# Patient Record
Sex: Female | Born: 1950 | Race: White | Hispanic: No | Smoking: Former smoker
Health system: Southern US, Community
[De-identification: ages and names within clinical notes are randomized; demographics above are authoritative.]

## PROBLEM LIST (undated history)

## (undated) DIAGNOSIS — D649 Anemia, unspecified: Secondary | ICD-10-CM

## (undated) DIAGNOSIS — J189 Pneumonia, unspecified organism: Secondary | ICD-10-CM

## (undated) DIAGNOSIS — M1990 Unspecified osteoarthritis, unspecified site: Secondary | ICD-10-CM

## (undated) DIAGNOSIS — R112 Nausea with vomiting, unspecified: Secondary | ICD-10-CM

## (undated) DIAGNOSIS — C801 Malignant (primary) neoplasm, unspecified: Secondary | ICD-10-CM

## (undated) DIAGNOSIS — Z9889 Other specified postprocedural states: Secondary | ICD-10-CM

## (undated) DIAGNOSIS — I10 Essential (primary) hypertension: Secondary | ICD-10-CM

## (undated) HISTORY — PX: TUBAL LIGATION: SHX77

## (undated) HISTORY — PX: MASTECTOMY, RADICAL: SHX710

## (undated) HISTORY — PX: SIMPLE MASTECTOMY: SHX2409

## (undated) HISTORY — PX: BREAST SURGERY: SHX581

## (undated) HISTORY — PX: COSMETIC SURGERY: SHX468

## (undated) HISTORY — PX: ABDOMINAL HYSTERECTOMY: SHX81

---

## 1970-09-21 HISTORY — PX: KNEE SURGERY: SHX244

## 1989-09-21 DIAGNOSIS — C801 Malignant (primary) neoplasm, unspecified: Secondary | ICD-10-CM

## 1989-09-21 HISTORY — DX: Malignant (primary) neoplasm, unspecified: C80.1

## 2007-06-15 LAB — HM COLONOSCOPY

## 2014-05-13 ENCOUNTER — Emergency Department (HOSPITAL_COMMUNITY): Payer: Self-pay

## 2014-05-13 ENCOUNTER — Encounter (HOSPITAL_COMMUNITY): Payer: Self-pay | Admitting: Emergency Medicine

## 2014-05-13 ENCOUNTER — Emergency Department (HOSPITAL_COMMUNITY)
Admission: EM | Admit: 2014-05-13 | Discharge: 2014-05-13 | Disposition: A | Discharge status: Home / Self Care | Payer: Self-pay | Attending: Emergency Medicine | Admitting: Emergency Medicine

## 2014-05-13 DIAGNOSIS — Z853 Personal history of malignant neoplasm of breast: Secondary | ICD-10-CM | POA: Insufficient documentation

## 2014-05-13 DIAGNOSIS — M79609 Pain in unspecified limb: Secondary | ICD-10-CM | POA: Insufficient documentation

## 2014-05-13 DIAGNOSIS — M1990 Unspecified osteoarthritis, unspecified site: Secondary | ICD-10-CM

## 2014-05-13 DIAGNOSIS — Z880 Allergy status to penicillin: Secondary | ICD-10-CM | POA: Insufficient documentation

## 2014-05-13 DIAGNOSIS — Z859 Personal history of malignant neoplasm, unspecified: Secondary | ICD-10-CM | POA: Insufficient documentation

## 2014-05-13 DIAGNOSIS — M19079 Primary osteoarthritis, unspecified ankle and foot: Secondary | ICD-10-CM | POA: Insufficient documentation

## 2014-05-13 DIAGNOSIS — Z79899 Other long term (current) drug therapy: Secondary | ICD-10-CM | POA: Insufficient documentation

## 2014-05-13 HISTORY — DX: Malignant (primary) neoplasm, unspecified: C80.1

## 2014-05-13 LAB — COMPREHENSIVE METABOLIC PANEL
ALT: 11 U/L (ref 0–35)
AST: 12 U/L (ref 0–37)
Albumin: 4 g/dL (ref 3.5–5.2)
Alkaline Phosphatase: 81 U/L (ref 39–117)
Anion gap: 14 (ref 5–15)
BUN: 16 mg/dL (ref 6–23)
CO2: 25 meq/L (ref 19–32)
Calcium: 9.8 mg/dL (ref 8.4–10.5)
Chloride: 102 mEq/L (ref 96–112)
Creatinine, Ser: 0.91 mg/dL (ref 0.50–1.10)
GFR calc Af Amer: 76 mL/min — ABNORMAL LOW (ref >90)
GFR, EST NON AFRICAN AMERICAN: 66 mL/min — AB (ref >90)
Glucose, Bld: 107 mg/dL — ABNORMAL HIGH (ref 70–99)
Potassium: 3.6 mEq/L — ABNORMAL LOW (ref 3.7–5.3)
SODIUM: 141 meq/L (ref 137–147)
Total Bilirubin: 0.8 mg/dL (ref 0.3–1.2)
Total Protein: 7.8 g/dL (ref 6.0–8.3)

## 2014-05-13 LAB — CBC WITH DIFFERENTIAL/PLATELET
BASOS PCT: 0 % (ref 0–1)
Basophils Absolute: 0 10*3/uL (ref 0.0–0.1)
Eosinophils Absolute: 0 10*3/uL (ref 0.0–0.7)
Eosinophils Relative: 0 % (ref 0–5)
HCT: 43.5 % (ref 36.0–46.0)
Hemoglobin: 14.7 g/dL (ref 12.0–15.0)
LYMPHS PCT: 13 % (ref 12–46)
Lymphs Abs: 1.5 10*3/uL (ref 0.7–4.0)
MCH: 33.3 pg (ref 26.0–34.0)
MCHC: 33.8 g/dL (ref 30.0–36.0)
MCV: 98.6 fL (ref 78.0–100.0)
Monocytes Absolute: 1 10*3/uL (ref 0.1–1.0)
Monocytes Relative: 8 % (ref 3–12)
NEUTROS ABS: 9.2 10*3/uL — AB (ref 1.7–7.7)
Neutrophils Relative %: 79 % — ABNORMAL HIGH (ref 43–77)
PLATELETS: 246 10*3/uL (ref 150–400)
RBC: 4.41 MIL/uL (ref 3.87–5.11)
RDW: 12.8 % (ref 11.5–15.5)
WBC: 11.7 10*3/uL — ABNORMAL HIGH (ref 4.0–10.5)

## 2014-05-13 LAB — SEDIMENTATION RATE: SED RATE: 27 mm/h — AB (ref 0–22)

## 2014-05-13 MED ORDER — MORPHINE SULFATE 4 MG/ML IJ SOLN
6.0000 mg | Freq: Once | INTRAMUSCULAR | Status: AC
  Administered 2014-05-13: 6 mg via INTRAVENOUS
  Filled 2014-05-13: qty 2

## 2014-05-13 MED ORDER — HYDROMORPHONE HCL PF 1 MG/ML IJ SOLN
1.0000 mg | Freq: Once | INTRAMUSCULAR | Status: AC
  Administered 2014-05-13: 1 mg via INTRAVENOUS
  Filled 2014-05-13: qty 1

## 2014-05-13 MED ORDER — MORPHINE SULFATE 30 MG PO TABS: 30.0000 mg | ORAL_TABLET | Freq: Four times a day (QID) | ORAL | Status: DC | PRN

## 2014-05-13 NOTE — Progress Notes (Signed)
VASCULAR LAB PRELIMINARY  PRELIMINARY  PRELIMINARY  PRELIMINARY  Right lower extremity venous duplex completed.    Preliminary report:  Right:  No evidence of DVT, superficial thrombosis, or Baker's cyst.  Tasmin Exantus, RVT 05/13/2014, 5:19 PM

## 2014-05-13 NOTE — ED Provider Notes (Addendum)
CSN: [id number]     Arrival date & time 05/13/14  1423 History   First MD Initiated Contact with Patient 05/13/14 1541     Chief Complaint  Patient presents with  . Foot Pain     (Consider location/radiation/quality/duration/timing/severity/associated sxs/prior Treatment) The history is provided by the patient.  Megan Rose is a 63 y.o. female hx of breast cancer here with right foot swelling and pain. Acute onset of right foot and ankle pain since last night. She also states that her her calf is tender as well. Denies any history of blood clots. Denies any injury to the foot or any history of gout. Denies any chest pressure shortness of breath. She states that she has low-grade temperature at home. Went to urgent care and was sent here for rule out DVT.   Past Medical History  Diagnosis Date  . Cancer    Past Surgical History  Procedure Laterality Date  . Mastectomy, radical Bilateral    No family history on file. History  Substance Use Topics  . Smoking status: Never Smoker   . Smokeless tobacco: Not on file  . Alcohol Use: No   OB History   Grav Para Term Preterm Abortions TAB SAB Ect Mult Living                 Review of Systems  Musculoskeletal:       R foot pain   All other systems reviewed and are negative.     Allergies  Penicillins and Codeine  Home Medications   Prior to Admission medications   Medication Sig Start Date End Date Taking? Authorizing Provider  aspirin 325 MG tablet Take 325 mg by mouth every 6 (six) hours as needed for mild pain.   Yes Historical Provider, MD  lisinopril-hydrochlorothiazide (PRINZIDE,ZESTORETIC) 20-12.5 MG per tablet Take 1 tablet by mouth daily.   Yes Historical Provider, MD  naproxen sodium (ANAPROX) 220 MG tablet Take 220 mg by mouth as needed (pain).   Yes Historical Provider, MD   BP 144/70  Pulse 97  Temp(Src) 99.2 F (37.3 C) (Oral)  Resp 20  SpO2 98% Physical Exam  Nursing note and vitals  reviewed. Constitutional:  Uncomfortable   HENT:  Head: Normocephalic.  Eyes: Conjunctivae are normal. Pupils are equal, round, and reactive to light.  Neck: Normal range of motion.  Cardiovascular: Normal rate, regular rhythm and normal heart sounds.   Pulmonary/Chest: Effort normal and breath sounds normal. No respiratory distress. She has no wheezes. She has no rales.  Abdominal: Soft. Bowel sounds are normal. She exhibits no distension. There is no tenderness.  Musculoskeletal:  R ankle swollen and slightly erythematous. Diffuse minimal R foot swelling. R calf minimally tender. 1+ pulses. Trouble wiggling toes due to swelling. Good capillary refill   Skin: Skin is warm and dry.  Psychiatric: She has a normal mood and affect. Her behavior is normal. Judgment and thought content normal.    ED Course  Procedures (including critical care time) Labs Review Labs Reviewed  CBC WITH DIFFERENTIAL - Abnormal; Notable for the following:    WBC 11.7 (*)    Neutrophils Relative % 79 (*)    Neutro Abs 9.2 (*)    All other components within normal limits  COMPREHENSIVE METABOLIC PANEL - Abnormal; Notable for the following:    Potassium 3.6 (*)    Glucose, Bld 107 (*)    GFR calc non Af Amer 66 (*)    GFR calc Af Amer 76 (*)  All other components within normal limits  SEDIMENTATION RATE - Abnormal; Notable for the following:    Sed Rate 27 (*)    All other components within normal limits    Imaging Review Dg Ankle Complete Right  05/13/2014   CLINICAL DATA:  Pain  EXAM: RIGHT ANKLE - COMPLETE 3+ VIEW  COMPARISON:  None.  FINDINGS: Frontal, oblique, and lateral views were obtained. There is no fracture. There is generalized soft tissue swelling with joint effusion. Ankle mortise appears intact. There is osteoarthritic change in a somewhat generalized manner. There are prominent inferior and posterior calcaneal spurs. There is spurring in the visualize dorsal midfoot region.  IMPRESSION:  Soft tissue swelling with joint effusion. Question ligamentous injury given these findings. Generalized osteoarthritic change. Calcifications adjacent to the medial malleolus are probably of arthropathic change, although prior avulsion type injury in this area cannot be excluded. There are prominent inferior and posterior calcaneal spurs.   Electronically Signed   By: Lowella Grip M.D.   On: 05/13/2014 16:18   Dg Foot Complete Right  05/13/2014   CLINICAL DATA:  Right ankle pain for 1 day.  EXAM: RIGHT FOOT COMPLETE - 3+ VIEW  COMPARISON:  None.  FINDINGS: The mineralization and alignment are normal. There is no evidence of acute fracture or dislocation. There are mild degenerative changes at the first metatarsal phalangeal joint and throughout the midfoot. Tibiotalar degenerative changes are also noted. There are prominent calcaneal spurs. The soft tissues appear diffusely prominent, especially in the dorsum of the foot. No foreign bodies or soft tissue emphysema identified.  IMPRESSION: No acute osseous findings. Degenerative changes and dorsal forefoot soft tissue swelling as described.   Electronically Signed   By: Camie Patience M.D.   On: 05/13/2014 16:20     EKG Interpretation None      MDM   Final diagnoses:  None    Megan Rose is a 63 y.o. female here with R ankle swelling. Consider DVT vs gout. Less likely to be septic joint. Will get xrays, DVT study. Will get ESR as well.   6:21 PM ESR minimally elevated. Xray showed arthritic changes. I think likely pain and swelling from joint effusion from arthritis. DVT study neg. Will give pain meds and crutches. Will have her f/u with ortho.     Wandra Arthurs, MD 05/13/14 Vernelle Emerald  Wandra Arthurs, MD 05/13/14 [phone]

## 2014-05-13 NOTE — Discharge Instructions (Signed)
Take morphine for pain.   Follow up with orthopedic doctor.   Use crutches, try to not bear much weight.   Return to ER if you have worsening pain, fever, worse swelling.

## 2014-05-13 NOTE — ED Notes (Signed)
She c/o right foot pain since yesterday.  She saw a doctor at [facility], who recommend she come here to be checked for blood clot.  She is in no distress.

## 2014-05-23 ENCOUNTER — Ambulatory Visit (HOSPITAL_COMMUNITY)
Admission: RE | Admit: 2014-05-23 | Discharge: 2014-05-23 | Disposition: A | Discharge status: Home / Self Care | Payer: Self-pay | Source: Ambulatory Visit | Attending: Orthopedic Surgery | Admitting: Orthopedic Surgery

## 2014-05-23 ENCOUNTER — Other Ambulatory Visit (HOSPITAL_COMMUNITY): Payer: Self-pay | Admitting: Orthopedic Surgery

## 2014-05-23 DIAGNOSIS — M25571 Pain in right ankle and joints of right foot: Secondary | ICD-10-CM

## 2014-05-23 DIAGNOSIS — M659 Unspecified synovitis and tenosynovitis, unspecified site: Secondary | ICD-10-CM | POA: Insufficient documentation

## 2014-05-23 DIAGNOSIS — M65979 Unspecified synovitis and tenosynovitis, unspecified ankle and foot: Secondary | ICD-10-CM | POA: Insufficient documentation

## 2014-05-23 DIAGNOSIS — R609 Edema, unspecified: Secondary | ICD-10-CM | POA: Insufficient documentation

## 2014-05-23 DIAGNOSIS — M7750 Other enthesopathy of unspecified foot: Secondary | ICD-10-CM | POA: Insufficient documentation

## 2014-05-23 MED ORDER — GADOBENATE DIMEGLUMINE 529 MG/ML IV SOLN
20.0000 mL | Freq: Once | INTRAVENOUS | Status: AC | PRN
  Administered 2014-05-23: 20 mL via INTRAVENOUS

## 2014-05-25 ENCOUNTER — Other Ambulatory Visit (HOSPITAL_COMMUNITY): Payer: Self-pay | Admitting: Orthopedic Surgery

## 2014-05-25 ENCOUNTER — Encounter (HOSPITAL_COMMUNITY): Payer: Self-pay | Admitting: *Deleted

## 2014-05-25 ENCOUNTER — Inpatient Hospital Stay (HOSPITAL_COMMUNITY): Payer: Self-pay | Admitting: Certified Registered Nurse Anesthetist

## 2014-05-25 ENCOUNTER — Inpatient Hospital Stay (HOSPITAL_COMMUNITY)
Admission: RE | Admit: 2014-05-25 | Discharge: 2014-05-30 | DRG: 493 | Disposition: A | Discharge status: Home Health | Payer: Self-pay | Source: Ambulatory Visit | Attending: Orthopedic Surgery | Admitting: Orthopedic Surgery

## 2014-05-25 ENCOUNTER — Inpatient Hospital Stay (HOSPITAL_COMMUNITY): Payer: Self-pay

## 2014-05-25 ENCOUNTER — Encounter (HOSPITAL_COMMUNITY)
Admission: RE | Disposition: A | Discharge status: Home Health | Payer: Self-pay | Source: Ambulatory Visit | Attending: Orthopedic Surgery

## 2014-05-25 ENCOUNTER — Encounter (HOSPITAL_COMMUNITY): Payer: Self-pay | Admitting: Certified Registered Nurse Anesthetist

## 2014-05-25 DIAGNOSIS — A4901 Methicillin susceptible Staphylococcus aureus infection, unspecified site: Secondary | ICD-10-CM | POA: Diagnosis present

## 2014-05-25 DIAGNOSIS — Z853 Personal history of malignant neoplasm of breast: Secondary | ICD-10-CM

## 2014-05-25 DIAGNOSIS — M19071 Primary osteoarthritis, right ankle and foot: Secondary | ICD-10-CM | POA: Diagnosis present

## 2014-05-25 DIAGNOSIS — Z79899 Other long term (current) drug therapy: Secondary | ICD-10-CM

## 2014-05-25 DIAGNOSIS — Z7982 Long term (current) use of aspirin: Secondary | ICD-10-CM

## 2014-05-25 DIAGNOSIS — M009 Pyogenic arthritis, unspecified: Principal | ICD-10-CM | POA: Diagnosis present

## 2014-05-25 DIAGNOSIS — Z6841 Body Mass Index (BMI) 40.0 and over, adult: Secondary | ICD-10-CM

## 2014-05-25 DIAGNOSIS — I10 Essential (primary) hypertension: Secondary | ICD-10-CM | POA: Diagnosis present

## 2014-05-25 HISTORY — DX: Unspecified osteoarthritis, unspecified site: M19.90

## 2014-05-25 HISTORY — DX: Other specified postprocedural states: R11.2

## 2014-05-25 HISTORY — DX: Essential (primary) hypertension: I10

## 2014-05-25 HISTORY — DX: Anemia, unspecified: D64.9

## 2014-05-25 HISTORY — DX: Other specified postprocedural states: Z98.890

## 2014-05-25 HISTORY — DX: Pneumonia, unspecified organism: J18.9

## 2014-05-25 HISTORY — PX: ANKLE ARTHROSCOPY: SHX545

## 2014-05-25 LAB — CBC
HCT: 37.4 % (ref 36.0–46.0)
Hemoglobin: 12.5 g/dL (ref 12.0–15.0)
MCH: 33.4 pg (ref 26.0–34.0)
MCHC: 33.4 g/dL (ref 30.0–36.0)
MCV: 100 fL (ref 78.0–100.0)
Platelets: 492 10*3/uL — ABNORMAL HIGH (ref 150–400)
RBC: 3.74 MIL/uL — ABNORMAL LOW (ref 3.87–5.11)
RDW: 12.8 % (ref 11.5–15.5)
WBC: 8.7 10*3/uL (ref 4.0–10.5)

## 2014-05-25 LAB — BASIC METABOLIC PANEL WITH GFR
Anion gap: 15 (ref 5–15)
BUN: 12 mg/dL (ref 6–23)
CO2: 24 meq/L (ref 19–32)
Calcium: 9.2 mg/dL (ref 8.4–10.5)
Chloride: 103 meq/L (ref 96–112)
Creatinine, Ser: 0.83 mg/dL (ref 0.50–1.10)
GFR calc Af Amer: 85 mL/min — ABNORMAL LOW
GFR calc non Af Amer: 73 mL/min — ABNORMAL LOW
Glucose, Bld: 100 mg/dL — ABNORMAL HIGH (ref 70–99)
Potassium: 4.1 meq/L (ref 3.7–5.3)
Sodium: 142 meq/L (ref 137–147)

## 2014-05-25 SURGERY — ARTHROSCOPY, ANKLE
Anesthesia: Regional | Site: Ankle | Laterality: Right

## 2014-05-25 MED ORDER — LISINOPRIL-HYDROCHLOROTHIAZIDE 20-25 MG PO TABS: 1.0000 | ORAL_TABLET | Freq: Every day | ORAL | Status: DC

## 2014-05-25 MED ORDER — HYDROMORPHONE HCL PF 1 MG/ML IJ SOLN: 0.2500 mg | INTRAMUSCULAR | Status: DC | PRN

## 2014-05-25 MED ORDER — FENTANYL CITRATE 0.05 MG/ML IJ SOLN
INTRAMUSCULAR | Status: DC | PRN
  Administered 2014-05-25: 50 ug via INTRAVENOUS

## 2014-05-25 MED ORDER — ONDANSETRON HCL 4 MG/2ML IJ SOLN: 4.0000 mg | Freq: Once | INTRAMUSCULAR | Status: DC | PRN

## 2014-05-25 MED ORDER — DEXAMETHASONE SODIUM PHOSPHATE 4 MG/ML IJ SOLN
INTRAMUSCULAR | Status: AC
  Filled 2014-05-25: qty 1

## 2014-05-25 MED ORDER — HYDROCHLOROTHIAZIDE 25 MG PO TABS
25.0000 mg | ORAL_TABLET | Freq: Every day | ORAL | Status: DC
  Administered 2014-05-26 – 2014-05-30 (×5): 25 mg via ORAL
  Filled 2014-05-25 (×6): qty 1

## 2014-05-25 MED ORDER — ONDANSETRON HCL 4 MG/2ML IJ SOLN: 4.0000 mg | Freq: Four times a day (QID) | INTRAMUSCULAR | Status: DC | PRN

## 2014-05-25 MED ORDER — ONDANSETRON HCL 4 MG/2ML IJ SOLN
INTRAMUSCULAR | Status: DC | PRN
Start: 2014-05-25 — End: 2014-05-25
  Administered 2014-05-25: 4 mg via INTRAVENOUS

## 2014-05-25 MED ORDER — METHOCARBAMOL 1000 MG/10ML IJ SOLN
500.0000 mg | Freq: Four times a day (QID) | INTRAMUSCULAR | Status: DC | PRN
  Filled 2014-05-25: qty 5

## 2014-05-25 MED ORDER — ACETAMINOPHEN 160 MG/5ML PO SOLN
325.0000 mg | ORAL | Status: DC | PRN
  Filled 2014-05-25: qty 20.3

## 2014-05-25 MED ORDER — MIDAZOLAM HCL 5 MG/5ML IJ SOLN
INTRAMUSCULAR | Status: DC | PRN
  Administered 2014-05-25: 2 mg via INTRAVENOUS

## 2014-05-25 MED ORDER — LISINOPRIL 20 MG PO TABS
20.0000 mg | ORAL_TABLET | Freq: Every day | ORAL | Status: DC
  Administered 2014-05-26 – 2014-05-30 (×5): 20 mg via ORAL
  Filled 2014-05-25 (×6): qty 1

## 2014-05-25 MED ORDER — METHOCARBAMOL 500 MG PO TABS
500.0000 mg | ORAL_TABLET | Freq: Four times a day (QID) | ORAL | Status: DC | PRN
  Administered 2014-05-26 – 2014-05-30 (×12): 500 mg via ORAL
  Filled 2014-05-25 (×14): qty 1

## 2014-05-25 MED ORDER — METOCLOPRAMIDE HCL 5 MG/ML IJ SOLN: 5.0000 mg | Freq: Three times a day (TID) | INTRAMUSCULAR | Status: DC | PRN

## 2014-05-25 MED ORDER — PROPOFOL 10 MG/ML IV BOLUS
INTRAVENOUS | Status: AC
  Filled 2014-05-25: qty 20

## 2014-05-25 MED ORDER — VANCOMYCIN HCL IN DEXTROSE 1-5 GM/200ML-% IV SOLN
1000.0000 mg | Freq: Two times a day (BID) | INTRAVENOUS | Status: DC
  Administered 2014-05-25 – 2014-05-30 (×10): 1000 mg via INTRAVENOUS
  Filled 2014-05-25 (×13): qty 200

## 2014-05-25 MED ORDER — MIDAZOLAM HCL 2 MG/2ML IJ SOLN
INTRAMUSCULAR | Status: AC
  Filled 2014-05-25: qty 2

## 2014-05-25 MED ORDER — LACTATED RINGERS IV SOLN
INTRAVENOUS | Status: DC | PRN
  Administered 2014-05-25 (×2): via INTRAVENOUS

## 2014-05-25 MED ORDER — SODIUM CHLORIDE 0.9 % IR SOLN
Status: DC | PRN
  Administered 2014-05-25: 6000 mL

## 2014-05-25 MED ORDER — FENTANYL CITRATE 0.05 MG/ML IJ SOLN
INTRAMUSCULAR | Status: AC
  Administered 2014-05-25: 100 ug via INTRAVENOUS
  Filled 2014-05-25: qty 2

## 2014-05-25 MED ORDER — HYDROMORPHONE HCL PF 1 MG/ML IJ SOLN
0.5000 mg | INTRAMUSCULAR | Status: DC | PRN
  Administered 2014-05-26 – 2014-05-27 (×3): 1 mg via INTRAVENOUS
  Administered 2014-05-27 – 2014-05-29 (×2): 0.5 mg via INTRAVENOUS
  Filled 2014-05-25 (×5): qty 1

## 2014-05-25 MED ORDER — DOCUSATE SODIUM 100 MG PO CAPS
100.0000 mg | ORAL_CAPSULE | Freq: Two times a day (BID) | ORAL | Status: DC
  Administered 2014-05-26 – 2014-05-30 (×9): 100 mg via ORAL
  Filled 2014-05-25 (×11): qty 1

## 2014-05-25 MED ORDER — OXYCODONE HCL 5 MG PO TABS: 5.0000 mg | ORAL_TABLET | Freq: Once | ORAL | Status: DC | PRN

## 2014-05-25 MED ORDER — ACETAMINOPHEN 325 MG PO TABS
325.0000 mg | ORAL_TABLET | ORAL | Status: DC | PRN
Start: 2014-05-25 — End: 2014-05-25

## 2014-05-25 MED ORDER — DIPHENHYDRAMINE HCL 12.5 MG/5ML PO ELIX
12.5000 mg | ORAL_SOLUTION | ORAL | Status: DC | PRN
  Administered 2014-05-29: 25 mg via ORAL
  Filled 2014-05-25: qty 10

## 2014-05-25 MED ORDER — FENTANYL CITRATE 0.05 MG/ML IJ SOLN
100.0000 ug | Freq: Once | INTRAMUSCULAR | Status: AC
  Administered 2014-05-25: 100 ug via INTRAVENOUS

## 2014-05-25 MED ORDER — FENTANYL CITRATE 0.05 MG/ML IJ SOLN
INTRAMUSCULAR | Status: AC
  Filled 2014-05-25: qty 5

## 2014-05-25 MED ORDER — SODIUM CHLORIDE 0.9 % IV SOLN
INTRAVENOUS | Status: DC
  Administered 2014-05-29: 21:00:00 via INTRAVENOUS

## 2014-05-25 MED ORDER — DEXAMETHASONE SODIUM PHOSPHATE 4 MG/ML IJ SOLN
INTRAMUSCULAR | Status: DC | PRN
  Administered 2014-05-25: 4 mg via INTRAVENOUS

## 2014-05-25 MED ORDER — ONDANSETRON HCL 4 MG/2ML IJ SOLN
INTRAMUSCULAR | Status: AC
  Filled 2014-05-25: qty 2

## 2014-05-25 MED ORDER — LACTATED RINGERS IV SOLN
INTRAVENOUS | Status: DC
  Administered 2014-05-25: 14:00:00 via INTRAVENOUS

## 2014-05-25 MED ORDER — VANCOMYCIN HCL IN DEXTROSE 1-5 GM/200ML-% IV SOLN
1000.0000 mg | Freq: Once | INTRAVENOUS | Status: DC
  Filled 2014-05-25 (×2): qty 200

## 2014-05-25 MED ORDER — OXYCODONE HCL 5 MG/5ML PO SOLN: 5.0000 mg | Freq: Once | ORAL | Status: DC | PRN

## 2014-05-25 MED ORDER — METOCLOPRAMIDE HCL 10 MG PO TABS: 5.0000 mg | ORAL_TABLET | Freq: Three times a day (TID) | ORAL | Status: DC | PRN

## 2014-05-25 MED ORDER — ASPIRIN EC 325 MG PO TBEC
325.0000 mg | DELAYED_RELEASE_TABLET | Freq: Two times a day (BID) | ORAL | Status: DC
  Administered 2014-05-26 – 2014-05-30 (×9): 325 mg via ORAL
  Filled 2014-05-25 (×11): qty 1

## 2014-05-25 MED ORDER — ONDANSETRON HCL 4 MG PO TABS: 4.0000 mg | ORAL_TABLET | Freq: Four times a day (QID) | ORAL | Status: DC | PRN

## 2014-05-25 MED ORDER — LIDOCAINE HCL (CARDIAC) 20 MG/ML IV SOLN
INTRAVENOUS | Status: AC
  Filled 2014-05-25: qty 5

## 2014-05-25 MED ORDER — OXYCODONE-ACETAMINOPHEN 5-325 MG PO TABS
1.0000 | ORAL_TABLET | ORAL | Status: DC | PRN
  Administered 2014-05-26 (×2): 2 via ORAL
  Administered 2014-05-26: 1 via ORAL
  Administered 2014-05-26 – 2014-05-30 (×20): 2 via ORAL
  Filled 2014-05-25 (×10): qty 2
  Filled 2014-05-25: qty 1
  Filled 2014-05-25 (×4): qty 2
  Filled 2014-05-25: qty 1
  Filled 2014-05-25 (×4): qty 2
  Filled 2014-05-25: qty 1
  Filled 2014-05-25 (×3): qty 2

## 2014-05-25 SURGICAL SUPPLY — 46 items
BLADE CUDA 5.5 (BLADE) IMPLANT
BLADE CUTTER GATOR 3.5 (BLADE) ×3 IMPLANT
BLADE GREAT WHITE 4.2 (BLADE) IMPLANT
BLADE GREAT WHITE 4.2MM (BLADE)
BLADE INCISOR PLUS 5.5 (BLADE) IMPLANT
BNDG COHESIVE 6X5 TAN STRL LF (GAUZE/BANDAGES/DRESSINGS) ×3 IMPLANT
BNDG GAUZE ELAST 4 BULKY (GAUZE/BANDAGES/DRESSINGS) ×3 IMPLANT
BUR OVAL 6.0 (BURR) IMPLANT
COVER SURGICAL LIGHT HANDLE (MISCELLANEOUS) ×3 IMPLANT
CUFF TOURNIQUET SINGLE 34IN LL (TOURNIQUET CUFF) IMPLANT
CUFF TOURNIQUET SINGLE 44IN (TOURNIQUET CUFF) IMPLANT
DRAPE ARTHROSCOPY W/POUCH 114 (DRAPES) ×3 IMPLANT
DRAPE C-ARM MINI 42X72 WSTRAPS (DRAPES) IMPLANT
DRAPE U-SHAPE 47X51 STRL (DRAPES) ×3 IMPLANT
DRSG ADAPTIC 3X8 NADH LF (GAUZE/BANDAGES/DRESSINGS) ×3 IMPLANT
DRSG EMULSION OIL 3X3 NADH (GAUZE/BANDAGES/DRESSINGS) ×3 IMPLANT
DRSG PAD ABDOMINAL 8X10 ST (GAUZE/BANDAGES/DRESSINGS) ×3 IMPLANT
DURAPREP 26ML APPLICATOR (WOUND CARE) ×3 IMPLANT
GAUZE SPONGE 4X4 12PLY STRL (GAUZE/BANDAGES/DRESSINGS) ×3 IMPLANT
GLOVE BIOGEL PI IND STRL 9 (GLOVE) ×1 IMPLANT
GLOVE BIOGEL PI INDICATOR 9 (GLOVE) ×2
GLOVE SURG ORTHO 9.0 STRL STRW (GLOVE) ×3 IMPLANT
GOWN STRL REUS W/ TWL XL LVL3 (GOWN DISPOSABLE) ×3 IMPLANT
GOWN STRL REUS W/TWL XL LVL3 (GOWN DISPOSABLE) ×6
KIT BASIN OR (CUSTOM PROCEDURE TRAY) ×3 IMPLANT
KIT ROOM TURNOVER OR (KITS) ×3 IMPLANT
MANIFOLD NEPTUNE II (INSTRUMENTS) ×3 IMPLANT
NEEDLE 18GX1X1/2 (RX/OR ONLY) (NEEDLE) ×3 IMPLANT
PACK ARTHROSCOPY DSU (CUSTOM PROCEDURE TRAY) ×3 IMPLANT
PAD ARMBOARD 7.5X6 YLW CONV (MISCELLANEOUS) ×6 IMPLANT
PADDING CAST COTTON 6X4 STRL (CAST SUPPLIES) ×3 IMPLANT
SET ARTHROSCOPY TUBING (MISCELLANEOUS) ×2
SET ARTHROSCOPY TUBING LN (MISCELLANEOUS) ×1 IMPLANT
SPONGE LAP 4X18 X RAY DECT (DISPOSABLE) ×3 IMPLANT
SUT ETHILON 4 0 PS 2 18 (SUTURE) ×6 IMPLANT
SUT MNCRL AB 3-0 PS2 18 (SUTURE) IMPLANT
SUT VIC AB 2-0 CT1 27 (SUTURE)
SUT VIC AB 2-0 CT1 TAPERPNT 27 (SUTURE) IMPLANT
SYR 20CC LL (SYRINGE) ×3 IMPLANT
TAPE STRIPS DRAPE STRL (GAUZE/BANDAGES/DRESSINGS) IMPLANT
TOWEL OR 17X24 6PK STRL BLUE (TOWEL DISPOSABLE) ×3 IMPLANT
TOWEL OR 17X26 10 PK STRL BLUE (TOWEL DISPOSABLE) ×3 IMPLANT
WAND 30 DEG SABER W/CORD (SURGICAL WAND) IMPLANT
WAND 90 DEG TURBOVAC W/CORD (SURGICAL WAND) ×3 IMPLANT
WATER STERILE IRR 1000ML POUR (IV SOLUTION) ×3 IMPLANT
WRAP KNEE MAXI GEL POST OP (GAUZE/BANDAGES/DRESSINGS) ×3 IMPLANT

## 2014-05-25 NOTE — Anesthesia Postprocedure Evaluation (Signed)
  Anesthesia Post-op Note  Patient: Megan Rose  Procedure(s) Performed: Procedure(s): Right Ankle and Subtalar Arthroscopy  (Right)  Patient Location: PACU  Anesthesia Type:MAC and Regional  Level of Consciousness: awake and alert   Airway and Oxygen Therapy: Patient Spontanous Breathing  Post-op Pain: none  Post-op Assessment: Post-op Vital signs reviewed, Patient's Cardiovascular Status Stable, Respiratory Function Stable, Patent Airway, No signs of Nausea or vomiting and Pain level controlled  Post-op Vital Signs: Reviewed and stable  Last Vitals:  Filed Vitals:   05/25/14 2024  BP: 155/76  Pulse: 92  Temp: 37.1 C  Resp: 18    Complications: No apparent anesthesia complications

## 2014-05-25 NOTE — Progress Notes (Signed)
Called X- ray they are on the way

## 2014-05-25 NOTE — Op Note (Signed)
05/25/2014  7:20 PM  PATIENT:  Megan Rose    PRE-OPERATIVE DIAGNOSIS:  Infection Right Subtalar and Ankle Joint  POST-OPERATIVE DIAGNOSIS:  Same  PROCEDURE:  Right Ankle and Subtalar Arthroscopy   SURGEON:  Newt Minion, MD  PHYSICIAN ASSISTANT:None ANESTHESIA:   General  PREOPERATIVE INDICATIONS:  Megan Rose is a  63 y.o. female with a diagnosis of Infection Right Subtalar and Ankle Joint who failed conservative measures and elected for surgical management.    The risks benefits and alternatives were discussed with the patient preoperatively including but not limited to the risks of infection, bleeding, nerve injury, cardiopulmonary complications, the need for revision surgery, among others, and the patient was willing to proceed.  OPERATIVE IMPLANTS: none  OPERATIVE FINDINGS: Thin fibrinous material consistent with infection in both the subtalar and tibiotalar joints  OPERATIVE PROCEDURE: Patient is a 63 year old woman who presented with acute spontaneous redness pain and warmth over the subtalar and ankle joints of the right foot and ankle. Patient's initial workup showed a white cell count of 25,000 in the aspirate gram stains were initially negative however these did return positive. Patient's blood workup showed a negative test for gout. Patient presented this time for urgent surgical intervention for infection in the subtalar and ankle joint on the right. Risk and benefits were discussed including persistent infection arthritis destruction of the joint. Patient states she understands and wished to proceed at this time. Description of procedure patient was brought to the operating room and underwent a popliteal block. After adequate levels of anesthesia were obtained patient's right lower extremity was prepped using DuraPrep draped into a sterile field. Attention was then focused on the subtalar joint. 2 arthroscopic portals were made and the joint was irrigated with 3 L  of lavage through the arthroscopy. Attention was then focused on the ankle joint. The anterior medial anterior lateral ankle portals were also used to irrigate 3 L to the ankle. Both subtalar and ankle joints had thin fibrinous tissue consistent with infection. There was no destruction of the cartilage. After drainage and irrigation the wounds were left open the ankle was covered with a sterile compressive dressing. Patient was taken to the PACU in stable condition. Patient will be started on vancomycin and Zosyn. Patient has a remote history which she states with a prolonged hospital stay to clear from mastectomy surgery.

## 2014-05-25 NOTE — Progress Notes (Signed)
Patient is status post arthroscopic debridement of the right ankle and subtalar joint with septic joints. Patient has a remote history of hospital acquired infection from mastectomy surgery. Anticipate patient will need prolonged IV antibiotics. Culture results are pending and were obtained in the office.

## 2014-05-25 NOTE — Anesthesia Procedure Notes (Addendum)
Procedure Name: MAC Date/Time: 05/25/2014 6:45 PM Performed by: Claris Che Pre-anesthesia Checklist: Patient identified, Emergency Drugs available, Suction available and Patient being monitored Patient Re-evaluated:Patient Re-evaluated prior to inductionOxygen Delivery Method: Simple face mask   Anesthesia Regional Block:  Popliteal block  Pre-Anesthetic Checklist: ,, timeout performed, Correct Patient, Correct Site, Correct Laterality, Correct Procedure, Correct Position, site marked, Risks and benefits discussed,  Surgical consent,  Pre-op evaluation,  At surgeon's request and post-op pain management  Laterality: Lower and Right  Prep: chloraprep       Needles:  Injection technique: Single-shot  Needle Type: Echogenic Stimulator Needle          Additional Needles:  Procedures: ultrasound guided (picture in chart) and nerve stimulator Popliteal block Narrative:  Injection made incrementally with aspirations every 5 mL.  Performed by: Personally  Anesthesiologist: Ramie Palladino  Additional Notes: H+P and labs reviewed, risks and benefits discussed with patient, procedure tolerated well without complications

## 2014-05-25 NOTE — Anesthesia Preprocedure Evaluation (Addendum)
Anesthesia Evaluation  Patient identified by MRN, date of birth, ID band Patient awake    Reviewed: Allergy & Precautions, H&P , NPO status , Patient's Chart, lab work & pertinent test results  History of Anesthesia Complications (+) PONV and history of anesthetic complications  Airway Mallampati: II TM Distance: >3 FB Neck ROM: Full    Dental  (+) Dental Advisory Given, Teeth Intact,    Pulmonary  breath sounds clear to auscultation        Cardiovascular hypertension, Pt. on medications Rhythm:Regular     Neuro/Psych negative neurological ROS     GI/Hepatic negative GI ROS, Neg liver ROS,   Endo/Other  Morbid obesity  Renal/GU      Musculoskeletal  (+) Arthritis -,   Abdominal   Peds  Hematology negative hematology ROS (+)   Anesthesia Other Findings   Reproductive/Obstetrics                         Anesthesia Physical Anesthesia Plan  ASA: III  Anesthesia Plan: General and Regional   Post-op Pain Management:    Induction: Intravenous  Airway Management Planned: LMA  Additional Equipment:   Intra-op Plan:   Post-operative Plan: Extubation in OR  Informed Consent:   Dental advisory given  Plan Discussed with: CRNA and Surgeon  Anesthesia Plan Comments:        Anesthesia Quick Evaluation

## 2014-05-25 NOTE — Transfer of Care (Signed)
Immediate Anesthesia Transfer of Care Note  Patient: Megan Rose  Procedure(s) Performed: Procedure(s): Right Ankle and Subtalar Arthroscopy  (Right)  Patient Location: PACU  Anesthesia Type:MAC and MAC combined with regional for post-op pain  Level of Consciousness: awake, alert , oriented and patient cooperative  Airway & Oxygen Therapy: Patient Spontanous Breathing  Post-op Assessment: Report given to PACU RN, Post -op Vital signs reviewed and stable and Patient moving all extremities X 4  Post vital signs: Reviewed and stable  Complications: No apparent anesthesia complications

## 2014-05-26 NOTE — Progress Notes (Signed)
Orthopedic Tech Progress Note Patient Details:  Megan Rose Feb 23, 1951 [id number]  Ortho Devices Type of Ortho Device: CAM walker Ortho Device/Splint Location: RLE Ortho Device/Splint Interventions: Criss Alvine 05/26/2014, 5:03 PM

## 2014-05-26 NOTE — Progress Notes (Signed)
Physical Therapy Evaluation Patient Details Name: Megan Rose MRN: [id number] DOB: 06-28-1951 Today's Date: 05/26/2014   History of Present Illness  63 yo female with R ankle pain.  R subtalar and ankle joint arthroscopy on 05/25/14.  Clinical Impression  Patient and daughter [facility]) present for evaluation, reports nerve block is wearing off, can feel pressure at ankle, but cannot feel foot or move toes yet, pain is not bad.  Post-op boot not present on evaluation, treating R foot as NWB today, can be WBAT with boot when it arrives.  Mod I bed mobility, Min Guard transfers and 'hopping' ambulation in room distance today.  Patient is appropriate to continue with skilled PT services to complete assessment.  Anticipate ability to return home with Home Health services to follow in a few days.    Follow Up Recommendations Home health PT    Equipment Recommendations  Rolling walker with 5" wheels    Recommendations for Other Services       Precautions / Restrictions Precautions Precautions: Fall Required Braces or Orthoses:  (Post-op boot Right) Restrictions Weight Bearing Restrictions: Yes RLE Weight Bearing: Weight bearing as tolerated Other Position/Activity Restrictions: with post-op boot      Mobility  Bed Mobility Overal bed mobility: Modified Independent                Transfers Overall transfer level: Needs assistance Equipment used: Rolling walker (2 wheeled) Transfers: Sit to/from Stand Sit to Stand: Min guard         General transfer comment: used NWB R LE due to decreased sensation and no post-op boot  Ambulation/Gait Ambulation/Gait assistance: Min guard Ambulation Distance (Feet): 15 Feet Assistive device: Rolling walker (2 wheeled) Gait Pattern/deviations:  (Hop on L foot due to no Post-op boot and nerve block)        Stairs            Wheelchair Mobility    Modified Rankin (Stroke Patients Only)       Balance Overall balance  assessment: Needs assistance   Sitting balance-Leahy Scale: Good     Standing balance support: Bilateral upper extremity supported Standing balance-Leahy Scale: Poor                               Pertinent Vitals/Pain Pain Assessment: 0-10 Pain Score: 2  Pain Location: R ankle (emerging from nerve block) Pain Intervention(s): Monitored during session    Home Living Family/patient expects to be discharged to:: Private residence Living Arrangements: Children Available Help at Discharge: Family;Available PRN/intermittently Type of Home: House Home Access: Level entry     Home Layout: One level Home Equipment: Cane - single point;Crutches Additional Comments: lives with son that works during the day    Prior Function Level of Independence: Independent               Journalist, newspaper        Extremity/Trunk Assessment   Upper Extremity Assessment: Overall WFL for tasks assessed           Lower Extremity Assessment: RLE deficits/detail RLE Deficits / Details: No post-op boot yet, altered sensation from nerve block       Communication   Communication: No difficulties  Cognition Arousal/Alertness: Awake/alert Behavior During Therapy: WFL for tasks assessed/performed Overall Cognitive Status: Within Functional Limits for tasks assessed  General Comments      Exercises        Assessment/Plan    PT Assessment Patient needs continued PT services  PT Diagnosis Difficulty walking   PT Problem List Decreased balance;Decreased mobility;Decreased knowledge of use of DME;Impaired sensation  PT Treatment Interventions DME instruction;Gait training;Functional mobility training;Therapeutic activities;Therapeutic exercise;Balance training;Patient/family education   PT Goals (Current goals can be found in the Care Plan section) Acute Rehab PT Goals Patient Stated Goal: To be safe to go home PT Goal Formulation: With  patient/family Time For Goal Achievement: 06/09/14 Potential to Achieve Goals: Good    Frequency Min 5X/week   Barriers to discharge Decreased caregiver support Son works during day    Co-evaluation               End of [place] During Treatment: Gait belt Activity Tolerance: Patient tolerated treatment well Patient left: in chair;with family/visitor present Nurse Communication: Mobility status (need to obtain post op boot)         Time: 2876-8115 PT Time Calculation (min): 25 min   Charges:   PT Evaluation $Initial PT Evaluation Tier I: 1 Procedure PT Treatments $Therapeutic Activity: 8-22 mins   PT G CodesZenia Resides, Zeina Akkerman L June 23, 2014, 5:03 PM

## 2014-05-26 NOTE — Progress Notes (Signed)
Subjective: 1 Day Post-Op Procedure(s) (LRB): Right Ankle and Subtalar Arthroscopy  (Right) Patient reports pain as mild.    Objective: Vital signs in last 24 hours: Temp:  [97.9 F (36.6 C)-98.8 F (37.1 C)] 97.9 F (36.6 C) (09/05 0623) Pulse Rate:  [85-94] 88 (09/05 0623) Resp:  [16-18] 18 (09/05 0623) BP: (139-161)/(57-78) 140/78 mmHg (09/05 0623) SpO2:  [94 %-98 %] 95 % (09/05 0623) Weight:  [92.987 kg (205 lb)] 92.987 kg (205 lb) (09/04 1247)  Intake/Output from previous day: 09/04 0701 - 09/05 0700 In: 1000 [I.V.:1000] Out: -  Intake/Output this shift: Total I/O In: 120 [P.O.:120] Out: -    Recent Labs  05/25/14 1321  HGB 12.5    Recent Labs  05/25/14 1321  WBC 8.7  RBC 3.74*  HCT 37.4  PLT 492*    Recent Labs  05/25/14 1321  NA 142  K 4.1  CL 103  CO2 24  BUN 12  CREATININE 0.83  GLUCOSE 100*  CALCIUM 9.2   No results found for this basename: LABPT, INR,  in the last 72 hours  Neurologically intact  Assessment/Plan: 1 Day Post-Op Procedure(s) (LRB): Right Ankle and Subtalar Arthroscopy  (Right) Continue IV ABX for infection.   YATES,MARK C 05/26/2014, 10:12 AM

## 2014-05-27 NOTE — H&P (Signed)
Megan Rose is an 63 y.o. female.   Chief Complaint: Septic right ankle and subtalar joint HPI: Patient is a 63 year old woman who presents with acute redness pain and swelling of the right ankle. Patient underwent aspiration with Dr. Marlou Sa. Initial aspiration was consistent with either gout or abscess. Patient was started on gout medication and followed up the next day. Patient still had pain despite gout medication and patient presents at this time for irrigation and debridement of the ankle and subtalar joint for presumed infection. Patient's initial aspirate Gram stain was negative however cultures of this time are positive for gram-positive cocci.  Past Medical History  Diagnosis Date  . Cancer   . Hypertension   . PONV (postoperative nausea and vomiting)   . Pneumonia   . Arthritis   . Anemia     Past Surgical History  Procedure Laterality Date  . Mastectomy, radical Bilateral   . Abdominal hysterectomy    . Tubal ligation    . Knee surgery Right 1972  . Cosmetic surgery Left     left chest after mastectomy    History reviewed. No pertinent family history. Social History:  reports that she has never smoked. She does not have any smokeless tobacco history on file. She reports that she drinks alcohol. Her drug history is not on file.  Allergies:  Allergies  Allergen Reactions  . Bee Venom Anaphylaxis  . Penicillins Anaphylaxis  . Codeine Other (See Comments)    Hallucinations ,     Medications Prior to Admission  Medication Sig Dispense Refill  . allopurinol (ZYLOPRIM) 100 MG tablet Take 100 mg by mouth 2 (two) times daily.      Marland Kitchen aspirin EC 325 MG tablet Take 325 mg by mouth 2 (two) times daily.      . colchicine 0.6 MG tablet Take 0.6 mg by mouth 2 (two) times daily.      Marland Kitchen lisinopril-hydrochlorothiazide (PRINZIDE,ZESTORETIC) 20-25 MG per tablet Take 1 tablet by mouth daily.      Marland Kitchen loratadine (CLARITIN) 10 MG tablet Take 10 mg by mouth daily as needed for  allergies.      . Menthol-Methyl Salicylate (MUSCLE RUB) 10-15 % CREA Apply 1 application topically as needed for muscle pain.      . traMADol (ULTRAM) 50 MG tablet Take 50 mg by mouth every 8 (eight) hours as needed for moderate pain.        Results for orders placed during the hospital encounter of 05/25/14 (from the past 48 hour(s))  BASIC METABOLIC PANEL     Status: Abnormal   Collection Time    05/25/14  1:21 PM      Result Value Ref Range   Sodium 142  137 - 147 mEq/L   Potassium 4.1  3.7 - 5.3 mEq/L   Chloride 103  96 - 112 mEq/L   CO2 24  19 - 32 mEq/L   Glucose, Bld 100 (*) 70 - 99 mg/dL   BUN 12  6 - 23 mg/dL   Creatinine, Ser 0.83  0.50 - 1.10 mg/dL   Calcium 9.2  8.4 - 10.5 mg/dL   GFR calc non Af Amer 73 (*) >90 mL/min   GFR calc Af Amer 85 (*) >90 mL/min   Comment: (NOTE)     The eGFR has been calculated using the CKD EPI equation.     This calculation has not been validated in all clinical situations.     eGFR's persistently <90 mL/min signify possible Chronic  Kidney     Disease.   Anion gap 15  5 - 15  CBC     Status: Abnormal   Collection Time    05/25/14  1:21 PM      Result Value Ref Range   WBC 8.7  4.0 - 10.5 K/uL   RBC 3.74 (*) 3.87 - 5.11 MIL/uL   Hemoglobin 12.5  12.0 - 15.0 g/dL   HCT 37.4  36.0 - 46.0 %   MCV 100.0  78.0 - 100.0 fL   MCH 33.4  26.0 - 34.0 pg   MCHC 33.4  30.0 - 36.0 g/dL   RDW 12.8  11.5 - 15.5 %   Platelets 492 (*) 150 - 400 K/uL   Dg Chest 2 View  05/25/2014   CLINICAL DATA:  Preoperative prior to right ankle surgery; history of hypertension  EXAM: CHEST  2 VIEW  COMPARISON:  None  FINDINGS: The lungs are adequately inflated and clear. The heart and pulmonary vascularity are normal. The mediastinum is normal in width. There is no pleural effusion. There are surgical clips in the left axillary region. The patient has undergone previous left mastectomy. The bony thorax exhibits no acute abnormality.  IMPRESSION: No active  cardiopulmonary disease.   Electronically Signed   By: David  Martinique   On: 05/25/2014 14:54    Review of Systems  All other systems reviewed and are negative.   Blood pressure 130/50, pulse 78, temperature 97.9 F (36.6 C), temperature source Oral, resp. rate 18, height 5' (1.524 m), weight 92.987 kg (205 lb), SpO2 96.00%. Physical Exam  On examination patient has redness and swelling over the sinus Tarsi. Palpation of the ankle and subtalar joint is painful. Assessment/Plan Assessment: Abscess septic joint right ankle and subtalar joint.  Plan: We'll plan for arthroscopic irrigation and debridement of the subtalar and tibiotalar joints. Risks and benefits were discussed including persistent infection. Due to the patient's previous history of infection from her mastectomy on this most likely is a MRSA infection. We'll start her on vancomycin postoperatively.  Rose,Megan V 05/27/2014, 11:21 AM

## 2014-05-27 NOTE — Progress Notes (Signed)
Physical Therapy Treatment Patient Details Name: Megan Rose MRN: [id number] DOB: [birth year] Today's Date: June 18, 2014    History of Present Illness 63 yo female with R ankle pain.  R subtalar and ankle joint arthroscopy on 05/25/14.    PT Comments    Pt needed significantly increased time with bed mobility (supine to edge of bed) for her to perform most of it with out assistance. Very guarded movements with all mobility.  Follow Up Recommendations  Home health PT     Equipment Recommendations  Rolling walker with 5" wheels       Precautions / Restrictions Precautions Precautions: Fall Required Braces or Orthoses: Other Brace/Splint Other Brace/Splint: cam boot with activity Restrictions RLE Weight Bearing: Weight bearing as tolerated Other Position/Activity Restrictions: with cam boot only    Mobility  Bed Mobility Overal bed mobility: Needs Assistance Bed Mobility: Supine to Sit;Sit to Supine     Supine to sit: Min assist Sit to supine: Min assist   General bed mobility comments: with bed flat and no rails: pt shown how to use belt to move right leg off and onto bed. needed assist once leg was off bed for support while scooting to edge of bed. needed assist to fully clear bed surface with right leg when lying back down.  Transfers Overall transfer level: Needs assistance Equipment used: Rolling walker (2 wheeled) Transfers: Sit to/from Stand Sit to Stand: Min assist         General transfer comment: cues for hand placement (pt trying to pull up on walker). assist to power up into standing. Min guarda assist stand from toilet and sitting to toilet/bed.  Ambulation/Gait Ambulation/Gait assistance: Min guard Ambulation Distance (Feet): 30 Feet (15 feet x2 reps) Assistive device: Rolling walker (2 wheeled) Gait Pattern/deviations: Step-to pattern;Antalgic;Trunk flexed Gait velocity: decreased Gait velocity interpretation: Below normal speed for  age/gender General Gait Details: cues on sequence and posture with gait.   Stairs            Wheelchair Mobility    Modified Rankin (Stroke Patients Only)          Cognition Arousal/Alertness: Awake/alert Behavior During Therapy: WFL for tasks assessed/performed Overall Cognitive Status: Within Functional Limits for tasks assessed                       Pertinent Vitals/Pain Pain Assessment: No/denies pain Pain Score: 8  Pain Location: right ankle Pain Intervention(s): Limited activity within patient's tolerance;Monitored during session;Premedicated before session;Repositioned;Ice applied     PT Goals (current goals can now be found in the care plan section) Acute Rehab PT Goals Patient Stated Goal: To be safe to go home PT Goal Formulation: With patient/family Time For Goal Achievement: 06/09/14 Potential to Achieve Goals: Good Progress towards PT goals: Progressing toward goals    Frequency  Min 5X/week    PT Plan Current plan remains appropriate       End of Session Equipment Utilized During Treatment: Gait belt;Other (comment) (cam boot) Activity Tolerance: Patient tolerated treatment well;Patient limited by pain Patient left: in bed;with call bell/phone within reach     Time: 1320-1350 PT Time Calculation (min): 30 min  Charges:  $Gait Training: 8-22 mins $Therapeutic Activity: 8-22 mins                    G Codes:      Willow Ora 06/18/2014, 3:11 PM  Willow Ora, PTA Office- [phone]

## 2014-05-27 NOTE — Progress Notes (Signed)
Subjective: 2 Days Post-Op Procedure(s) (LRB): Right Ankle and Subtalar Arthroscopy  (Right) Patient reports pain as moderate.    Objective: Vital signs in last 24 hours: Temp:  [97.9 F (36.6 C)-98.2 F (36.8 C)] 97.9 F (36.6 C) (09/06 0619) Pulse Rate:  [72-80] 78 (09/06 0619) Resp:  [18] 18 (09/06 0619) BP: (130-135)/(50-60) 130/50 mmHg (09/06 0619) SpO2:  [95 %-97 %] 96 % (09/06 0619)  Intake/Output from previous day: 09/05 0701 - 09/06 0700 In: 840 [P.O.:840] Out: -  Intake/Output this shift: Total I/O In: 120 [P.O.:120] Out: -    Recent Labs  05/25/14 1321  HGB 12.5    Recent Labs  05/25/14 1321  WBC 8.7  RBC 3.74*  HCT 37.4  PLT 492*    Recent Labs  05/25/14 1321  NA 142  K 4.1  CL 103  CO2 24  BUN 12  CREATININE 0.83  GLUCOSE 100*  CALCIUM 9.2   No results found for this basename: LABPT, INR,  in the last 72 hours  Neurologically intact  Assessment/Plan: 2 Days Post-Op Procedure(s) (LRB): Right Ankle and Subtalar Arthroscopy  (Right) Up with therapy having pain problems.  YATES,MARK C 05/27/2014, 9:08 AM

## 2014-05-28 NOTE — Progress Notes (Signed)
Subjective: 3 Days Post-Op Procedure(s) (LRB): Right Ankle and Subtalar Arthroscopy  (Right) Patient reports pain as 4 on 0-10 scale.    Objective: Vital signs in last 24 hours: Temp:  [97.7 F (36.5 C)-98.8 F (37.1 C)] 97.7 F (36.5 C) (09/07 0720) Pulse Rate:  [84-92] 84 (09/07 0720) Resp:  [16-18] 16 (09/07 0720) BP: (128-157)/(59-64) 132/62 mmHg (09/07 0720) SpO2:  [94 %-98 %] 98 % (09/07 0720)  Intake/Output from previous day: 09/06 0701 - 09/07 0700 In: 480 [P.O.:240; I.V.:240] Out: -  Intake/Output this shift:     Recent Labs  05/25/14 1321  HGB 12.5    Recent Labs  05/25/14 1321  WBC 8.7  RBC 3.74*  HCT 37.4  PLT 492*    Recent Labs  05/25/14 1321  NA 142  K 4.1  CL 103  CO2 24  BUN 12  CREATININE 0.83  GLUCOSE 100*  CALCIUM 9.2   No results found for this basename: LABPT, INR,  in the last 72 hours  Neurologically intact  Assessment/Plan: 3 Days Post-Op Procedure(s) (LRB): Right Ankle and Subtalar Arthroscopy  (Right) Up with therapy moving slowly. Culture final pending. May need PIC line and a few weeks IV ABX.   Tocara Mennen C 05/28/2014, 10:34 AM

## 2014-05-28 NOTE — Progress Notes (Signed)
Physical Therapy Treatment Patient Details Name: Megan Rose MRN: [id number] DOB: 07/18/1951 Today's Date: 05/28/2014    History of Present Illness 63 yo female with R ankle pain.  R subtalar and ankle joint arthroscopy on 05/25/14.    PT Comments    Patient making some progress with mobility. Still having some pain in the CAM boot but stated the fit felt better this session. Educated on safety at home and easing home set up. Patient planning to DC home tomorrow. Patient sit on chair to get up the steps and will not have to bump up a step. Will continue with current POC  Follow Up Recommendations  Home health PT     Equipment Recommendations  Rolling walker with 5" wheels    Recommendations for Other Services       Precautions / Restrictions Precautions Precautions: Fall Required Braces or Orthoses: Other Brace/Splint Other Brace/Splint: cam boot with activity Restrictions Weight Bearing Restrictions: Yes RLE Weight Bearing: Weight bearing as tolerated Other Position/Activity Restrictions: with cam boot only    Mobility  Bed Mobility Overal bed mobility: Needs Assistance       Supine to sit: Min assist     General bed mobility comments: Patient using belt to move R LE off of bed. Needed assist some with leg as we applied cam boot while lying down for better fit  Transfers Overall transfer level: Needs assistance Equipment used: Rolling walker (2 wheeled)   Sit to Stand: Min guard         General transfer comment: cues for hand placement (pt trying to pull up on walker).  Ambulation/Gait Ambulation/Gait assistance: Min guard Ambulation Distance (Feet): 30 Feet Assistive device: Rolling walker (2 wheeled) Gait Pattern/deviations: Step-to pattern   Gait velocity interpretation: Below normal speed for age/gender General Gait Details: Cues for safety with RW   Stairs            Wheelchair Mobility    Modified Rankin (Stroke Patients Only)        Balance                                    Cognition Arousal/Alertness: Awake/alert Behavior During Therapy: WFL for tasks assessed/performed Overall Cognitive Status: Within Functional Limits for tasks assessed                      Exercises      General Comments        Pertinent Vitals/Pain Pain Score: 7  Pain Location: R ankle Pain Intervention(s): Monitored during session;Premedicated before session    Home Living                      Prior Function            PT Goals (current goals can now be found in the care plan section) Progress towards PT goals: Progressing toward goals    Frequency  Min 5X/week    PT Plan Current plan remains appropriate    Co-evaluation             End of Session Equipment Utilized During Treatment: Gait belt Activity Tolerance: Patient tolerated treatment well Patient left: in chair;with call bell/phone within reach;with family/visitor present     Time: 3846-6599 PT Time Calculation (min): 46 min  Charges:  $Gait Training: 23-37 mins $Therapeutic Activity: 8-22 mins  G Codes:      Jacqualyn Posey 05/28/2014, 12:11 PM 05/28/2014 Jacqualyn Posey PTA [phone] pager [phone] office

## 2014-05-29 ENCOUNTER — Encounter (HOSPITAL_COMMUNITY): Payer: Self-pay | Admitting: Orthopedic Surgery

## 2014-05-29 MED ORDER — SODIUM CHLORIDE 0.9 % IJ SOLN
10.0000 mL | INTRAMUSCULAR | Status: DC | PRN
  Administered 2014-05-30: 10 mL

## 2014-05-29 MED ORDER — VANCOMYCIN HCL IN DEXTROSE 1-5 GM/200ML-% IV SOLN: 1000.0000 mg | Freq: Two times a day (BID) | INTRAVENOUS | Status: DC

## 2014-05-29 MED ORDER — OXYCODONE-ACETAMINOPHEN 5-325 MG PO TABS: 1.0000 | ORAL_TABLET | ORAL | Status: DC | PRN

## 2014-05-29 NOTE — Care Management Note (Signed)
CARE MANAGEMENT NOTE 05/29/2014  Patient:  Megan Rose, Megan Rose   Account Number:  [id number]  Date Initiated:  05/29/2014  Documentation initiated by:  Ricki Miller  Subjective/Objective Assessment:   64 yr old female admitted with infection of right ankle, s/p right ankle and subtalar arthroscopy.     Action/Plan:   Case manager spoke with patient concerning home health and DME needs at discharge. Referral called to [facility], [place].Patient will have PICC and IV antibiotics for 3 weeks.   Anticipated DC Date:  05/29/2014   Anticipated DC Plan:  [place]  CM consult      [place]   Choice offered to / List presented to:  C-1 Patient   DME arranged  3-N-1      DME agency  [facility] arranged  HH-1 RN  IV Antibiotics      [place].   Status of service:  Completed, signed off Medicare Important Message given?   (If response is "NO", the following Medicare IM given date fields will be blank) Date Medicare IM given:   Medicare IM given by:   Date Additional Medicare IM given:   Additional Medicare IM given by:    Discharge Disposition:  [place]  Per UR Regulation:  Reviewed for med. necessity/level of care/duration of stay

## 2014-05-29 NOTE — Progress Notes (Signed)
Physical Therapy Treatment Patient Details Name: Megan Rose MRN: [id number] DOB: Aug 01, 1951 Today's Date: 05/29/2014    History of Present Illness 63 yo female with R ankle pain.  R subtalar and ankle joint arthroscopy on 05/25/14.    PT Comments    Patient progressing some with ambulation this session. Voicing some concerns about HH but case manager able to assist as able. Patient is planning to DC home tomorrow with care from her son  Follow Up Recommendations  Home health PT     Equipment Recommendations  Rolling walker with 5" wheels    Recommendations for Other Services       Precautions / Restrictions Precautions Precautions: Fall Required Braces or Orthoses: Other Brace/Splint Restrictions Weight Bearing Restrictions: Yes RLE Weight Bearing: Weight bearing as tolerated Other Position/Activity Restrictions: with cam boot only    Mobility  Bed Mobility Overal bed mobility: Modified Independent                Transfers   Equipment used: Rolling walker (2 wheeled)   Sit to Stand: Supervision            Ambulation/Gait Ambulation/Gait assistance: Min guard Ambulation Distance (Feet): 40 Feet   Gait Pattern/deviations: Step-to pattern Gait velocity: decreased   General Gait Details: Patient limited by fatigue but overall doing well with safety and use of RW   Stairs            Wheelchair Mobility    Modified Rankin (Stroke Patients Only)       Balance                                    Cognition Arousal/Alertness: Awake/alert Behavior During Therapy: WFL for tasks assessed/performed Overall Cognitive Status: Within Functional Limits for tasks assessed                      Exercises      General Comments        Pertinent Vitals/Pain Pain Score: 5  Pain Location: R ankle pain Pain Intervention(s): Monitored during session    Home Living                      Prior Function             PT Goals (current goals can now be found in the care plan section) Progress towards PT goals: Progressing toward goals    Frequency  Min 5X/week    PT Plan Current plan remains appropriate    Co-evaluation             End of Session Equipment Utilized During Treatment: Gait belt Activity Tolerance: Patient tolerated treatment well Patient left: in chair;with call bell/phone within reach;with family/visitor present     Time: 2440-1027 PT Time Calculation (min): 41 min  Charges:  $Gait Training: 23-37 mins $Therapeutic Activity: 8-22 mins                    G Codes:      Jacqualyn Posey 05/29/2014, 10:39 AM 05/29/2014 Jacqualyn Posey PTA [phone] pager [phone] office

## 2014-05-29 NOTE — Progress Notes (Signed)
Peripherally Inserted Central Catheter/Midline Placement  The IV Nurse has discussed with the patient and/or persons authorized to consent for the patient, the purpose of this procedure and the potential benefits and risks involved with this procedure.  The benefits include less needle sticks, lab draws from the catheter and patient may be discharged home with the catheter.  Risks include, but not limited to, infection, bleeding, blood clot (thrombus formation), and puncture of an artery; nerve damage and irregular heat beat.  Alternatives to this procedure were also discussed.  PICC/Midline Placement Documentation  PICC / Midline Single Lumen 00/34/91 PICC Right Basilic 43 cm 1 cm (Active)       Jule Economy Horton 05/29/2014, 6:40 PM

## 2014-05-29 NOTE — Progress Notes (Signed)
Utilization review completed.  

## 2014-05-29 NOTE — Discharge Summary (Addendum)
Physician Discharge Summary  Patient ID: Megan Rose MRN: [id number] DOB/AGE: December 05, 1950 63 y.o.  Admit date: 05/25/2014 Discharge date: 05/29/2014  Admission Diagnoses: Septic ankle and subtalar joint  Discharge Diagnoses:  Active Problems:   Septic joint of right ankle and foot   Discharged Condition: stable  Hospital Course: Patient's hospital course was essentially unremarkable. She underwent arthroscopic debridement of the subtalar and ankle joint. Postoperatively patient progressed well on IV vancomycin.  Consults: None  Significant Diagnostic Studies: labs: Routine labs  Treatments: antibiotics: vancomycin and surgery: See operative note  Discharge Exam: Blood pressure 120/56, pulse 80, temperature 98.1 F (36.7 C), temperature source Oral, resp. rate 16, height 5' (1.524 m), weight 92.987 kg (205 lb), SpO2 94.00%. Incision/Wound: clean dry and intact no cellulitis  Disposition: 01-Home or Self Care  Discharge Instructions   Call MD / Call 911    Complete by:  As directed   If you experience chest pain or shortness of breath, CALL 911 and be transported to the hospital emergency room.  If you develope a fever above 101 F, pus (white drainage) or increased drainage or redness at the wound, or calf pain, call your surgeon's office.     Constipation Prevention    Complete by:  As directed   Drink plenty of fluids.  Prune juice may be helpful.  You may use a stool softener, such as Colace (over the counter) 100 mg twice a day.  Use MiraLax (over the counter) for constipation as needed.     Diet - low sodium heart healthy    Complete by:  As directed      Increase activity slowly as tolerated    Complete by:  As directed             Medication List         allopurinol 100 MG tablet  Commonly known as:  ZYLOPRIM  Take 100 mg by mouth 2 (two) times daily.     aspirin EC 325 MG tablet  Take 325 mg by mouth 2 (two) times daily.     colchicine 0.6 MG tablet   Take 0.6 mg by mouth 2 (two) times daily.     lisinopril-hydrochlorothiazide 20-25 MG per tablet  Commonly known as:  PRINZIDE,ZESTORETIC  Take 1 tablet by mouth daily.     loratadine 10 MG tablet  Commonly known as:  CLARITIN  Take 10 mg by mouth daily as needed for allergies.     MUSCLE RUB 10-15 % Crea  Apply 1 application topically as needed for muscle pain.     oxyCODONE-acetaminophen 5-325 MG per tablet  Commonly known as:  PERCOCET/ROXICET  Take 1 tablet by mouth every 4 (four) hours as needed for moderate pain or severe pain.     traMADol 50 MG tablet  Commonly known as:  ULTRAM  Take 50 mg by mouth every 8 (eight) hours as needed for moderate pain.     vancomycin 1 GM/200ML Soln  Commonly known as:  VANCOCIN  Inject 200 mLs (1,000 mg total) into the vein every 12 (twelve) hours.           Follow-up Information   Follow up with Megan Rosekrans V, MD In 1 week.   Specialty:  Orthopedic Surgery   Contact information:   [address] [place] [id number] [phone]       Signed: Newt Minion 05/29/2014, 6:44 AM  Patient's discharge was delayed due to to a delayed in obtaining her PICC line. Patient  received her PICC line last night and is ready for discharge to home today no change in her medical condition or medications. Patient did complain of some increased pain in the ankle however examination this time shows there to be no cellulitis no redness no drainage no signs of infection. Patient does have some swelling which may be do to her her increased mobility yesterday. Plan to followup in the office next week. Discharge on 3 weeks of home IV vancomycin. Cultures positive for staph aureus.

## 2014-05-29 NOTE — Progress Notes (Addendum)
Patient ID: Megan Rose, female   DOB: Sep 16, 1951, 63 y.o.   MRN: [id number] Plan for placement of PICC line today with discharge with IV vancomycin for 3 weeks. Patient states that family will be able to pick her up for discharge tomorrow morning.  Dressing is removed this morning there is no redness no cellulitis no signs of infection.

## 2014-05-30 MED ORDER — VANCOMYCIN HCL IN DEXTROSE 1-5 GM/200ML-% IV SOLN
1000.0000 mg | Freq: Two times a day (BID) | INTRAVENOUS | Status: DC
  Administered 2014-05-30: 1000 mg via INTRAVENOUS
  Filled 2014-05-30 (×2): qty 200

## 2014-05-30 MED ORDER — HEPARIN SOD (PORK) LOCK FLUSH 100 UNIT/ML IV SOLN
250.0000 [IU] | INTRAVENOUS | Status: AC | PRN
  Administered 2014-05-30: 250 [IU]

## 2014-05-30 NOTE — Discharge Instructions (Signed)
Elevate right foot. Weightbearing as tolerated. May shower and get the foot wet. Apply ice to right ankle 15 minutes every 2 hours as needed for swelling.. Do not place ice directly on the skin.

## 2014-05-30 NOTE — Progress Notes (Signed)
PT Cancellation Note  Patient Details Name: Megan Rose MRN: [id number] DOB: 09-16-1951   Cancelled Treatment:    Reason Eval/Treat Not Completed: Pain limiting ability to participate. Patient with increased pain today and has permission to DC later tonight so she can rest. Patient declined any mobility today. She is safe from a PT standpoint to return home based on progression of goals the last couple of days   Jacqualyn Posey 05/30/2014, 8:43 AM

## 2014-05-30 NOTE — Plan of Care (Signed)
Problem: Phase III Progression Outcomes Goal: IV changed to normal saline lock Outcome: Not Met (add Reason) Pt to d/c home with PICC d/t continued home IV antibiotic therapy

## 2014-06-28 MED ORDER — ROPIVACAINE HCL 5 MG/ML IJ SOLN
INTRAMUSCULAR | Status: DC | PRN
  Administered 2014-05-25: 30 mL via PERINEURAL

## 2014-06-28 NOTE — Addendum Note (Signed)
Addendum created 06/28/14 2145 by Laurie Panda, MD   Modules edited: Anesthesia Blocks and Procedures, Anesthesia Medication Administration, Clinical Notes   Clinical Notes:  File: [id number]

## 2014-07-13 ENCOUNTER — Ambulatory Visit: Payer: Self-pay | Attending: Family Medicine | Admitting: Family Medicine

## 2014-07-13 ENCOUNTER — Encounter: Payer: Self-pay | Admitting: Family Medicine

## 2014-07-13 VITALS — BP 132/84 | HR 80 | Temp 97.7°F | Resp 18 | Ht 61.0 in | Wt 212.0 lb

## 2014-07-13 DIAGNOSIS — I10 Essential (primary) hypertension: Secondary | ICD-10-CM | POA: Insufficient documentation

## 2014-07-13 DIAGNOSIS — Z6841 Body Mass Index (BMI) 40.0 and over, adult: Secondary | ICD-10-CM | POA: Insufficient documentation

## 2014-07-13 DIAGNOSIS — R300 Dysuria: Secondary | ICD-10-CM | POA: Insufficient documentation

## 2014-07-13 DIAGNOSIS — Z418 Encounter for other procedures for purposes other than remedying health state: Secondary | ICD-10-CM

## 2014-07-13 DIAGNOSIS — Z299 Encounter for prophylactic measures, unspecified: Secondary | ICD-10-CM

## 2014-07-13 DIAGNOSIS — Z23 Encounter for immunization: Secondary | ICD-10-CM

## 2014-07-13 DIAGNOSIS — R222 Localized swelling, mass and lump, trunk: Secondary | ICD-10-CM | POA: Insufficient documentation

## 2014-07-13 LAB — POCT URINALYSIS DIPSTICK
BILIRUBIN UA: NEGATIVE
Blood, UA: NEGATIVE
Glucose, UA: NEGATIVE
Ketones, UA: NEGATIVE
LEUKOCYTES UA: NEGATIVE
NITRITE UA: NEGATIVE
Protein, UA: NEGATIVE
Spec Grav, UA: 1.02
Urobilinogen, UA: 0.2
pH, UA: 5.5

## 2014-07-13 MED ORDER — NITROFURANTOIN MONOHYD MACRO 100 MG PO CAPS: 100.0000 mg | ORAL_CAPSULE | Freq: Two times a day (BID) | ORAL | Status: DC

## 2014-07-13 MED ORDER — LISINOPRIL-HYDROCHLOROTHIAZIDE 20-25 MG PO TABS: 1.0000 | ORAL_TABLET | Freq: Every day | ORAL | Status: DC

## 2014-07-13 NOTE — Progress Notes (Signed)
   Subjective:    Patient ID: Megan Rose, female    DOB: [birth year], 63 y.o.   MRN: [id number] CC: establish care, dysuria  HPI 63 yo F presents to establish care:  1. Dysuria: x 3 weeks. Taking azo standard, cystex and aspirin. Stopped azo 3 days ago. Taking the pain away for a while. Had some ? Blood in her urine 3 weeks ago. Last had similar symptoms 6 months ago. Had a bit of nausea. No vomiting or fever. Recently treated with vancomycin x 4 weeks, IV, for MSSA cellulitis. Stopped two weeks ago. No vaginal discharge.   2. Chest lump: had PICC line placed in R upper chest for IV abx. Developed a superficial lump following PICC line removal. No fever.   Soc hx: non smoker   Med hx: HTN Surg Hx: R ankle arthroscopy   Review of Systems As per HPI     Objective:   Physical Exam  BP 132/84  Pulse 80  Temp(Src) 97.7 F (36.5 C) (Oral)  Resp 18  Ht 5\' 1"  (1.549 m)  Wt 212 lb (96.163 kg)  BMI 40.08 kg/m2  SpO2 97% General appeaarance: alert, cooperative and no distress Back: symmetric, no curvature. ROM normal. No CVA tenderness. Chest: no swelling or mass.  Lungs: clear to auscultation bilaterally Heart: regular rate and rhythm, S1, S2 normal, no murmur, click, rub or gallop Abdomen: obese, no abdominal mass or suprapubic tenderness        Assessment & Plan:

## 2014-07-13 NOTE — Patient Instructions (Signed)
Megan Rose,  Thank you for coming in today. It was a pleasure meeting you. I look forward to being your primary doctor.  1. Dysuria: normal urinalysis w/o evidence of infection.  Start macrobid 100 mg twice daily for 3 days You will be called with urine culture results.  F/u if symptoms persist following macrobid  Dr. Adrian Blackwater

## 2014-07-13 NOTE — Assessment & Plan Note (Signed)
A: dysuria with normal UA P: Urine culture macrobid 100 mg BID x 3 days

## 2014-07-13 NOTE — Progress Notes (Signed)
Establish Care Stated has burning with urination and frequency

## 2014-07-15 LAB — URINE CULTURE

## 2014-07-16 ENCOUNTER — Telehealth: Payer: Self-pay | Admitting: *Deleted

## 2014-07-16 NOTE — Telephone Encounter (Signed)
Left message to return call 

## 2014-07-16 NOTE — Telephone Encounter (Signed)
Message copied by Betti Cruz on Mon Jul 16, 2014  2:42 PM ------      Message from: Boykin Nearing      Created: Mon Jul 16, 2014 12:47 PM       Urine culture with multiple morphotypes, no single bacteria. Negative urine culture ------

## 2014-07-20 ENCOUNTER — Telehealth: Payer: Self-pay | Admitting: *Deleted

## 2014-07-20 NOTE — Telephone Encounter (Signed)
Pt called today to informed, finish antibiotic and feeling better. Still has slightly burning with urination.  Stated will call back if Sx persist

## 2014-12-27 ENCOUNTER — Telehealth: Payer: Self-pay | Admitting: Family Medicine

## 2014-12-27 NOTE — Telephone Encounter (Signed)
Pt requesting a refill for her cystitis which has returned. Pt states that she picked up the prescription from [facility] pharmacy the last time. Please f/u.

## 2014-12-28 NOTE — Telephone Encounter (Signed)
Notified need schedule appointment with PCP Pt stated going out of  town will continue taking AZO  Advised to go to Urgent care if Sx worsen

## 2015-04-23 ENCOUNTER — Encounter: Payer: Self-pay | Admitting: Family Medicine

## 2015-04-23 ENCOUNTER — Ambulatory Visit: Payer: Self-pay | Attending: Family Medicine | Admitting: Family Medicine

## 2015-04-23 ENCOUNTER — Encounter: Payer: Self-pay | Admitting: *Deleted

## 2015-04-23 VITALS — BP 131/81 | HR 76 | Temp 98.3°F | Resp 16 | Ht 61.0 in | Wt 233.0 lb

## 2015-04-23 DIAGNOSIS — M25571 Pain in right ankle and joints of right foot: Secondary | ICD-10-CM | POA: Insufficient documentation

## 2015-04-23 DIAGNOSIS — R635 Abnormal weight gain: Secondary | ICD-10-CM | POA: Insufficient documentation

## 2015-04-23 DIAGNOSIS — M009 Pyogenic arthritis, unspecified: Secondary | ICD-10-CM

## 2015-04-23 DIAGNOSIS — E669 Obesity, unspecified: Secondary | ICD-10-CM | POA: Insufficient documentation

## 2015-04-23 DIAGNOSIS — Z853 Personal history of malignant neoplasm of breast: Secondary | ICD-10-CM | POA: Insufficient documentation

## 2015-04-23 DIAGNOSIS — Z87891 Personal history of nicotine dependence: Secondary | ICD-10-CM | POA: Insufficient documentation

## 2015-04-23 DIAGNOSIS — R0602 Shortness of breath: Secondary | ICD-10-CM | POA: Insufficient documentation

## 2015-04-23 DIAGNOSIS — R300 Dysuria: Secondary | ICD-10-CM

## 2015-04-23 LAB — POCT URINALYSIS DIPSTICK
Bilirubin, UA: NEGATIVE
Blood, UA: NEGATIVE
Glucose, UA: NEGATIVE
Ketones, UA: NEGATIVE
Nitrite, UA: NEGATIVE
Protein, UA: NEGATIVE
Spec Grav, UA: 1.025
Urobilinogen, UA: 0.2
pH, UA: 6.5

## 2015-04-23 MED ORDER — ACETAMINOPHEN-CODEINE #3 300-30 MG PO TABS
1.0000 | ORAL_TABLET | Freq: Three times a day (TID) | ORAL | Status: DC | PRN
Start: 2015-04-23 — End: 2015-10-22

## 2015-04-23 MED ORDER — ALBUTEROL SULFATE HFA 108 (90 BASE) MCG/ACT IN AERS: 2.0000 | INHALATION_SPRAY | Freq: Four times a day (QID) | RESPIRATORY_TRACT | Status: DC | PRN

## 2015-04-23 MED ORDER — LISINOPRIL-HYDROCHLOROTHIAZIDE 20-25 MG PO TABS: 1.0000 | ORAL_TABLET | Freq: Every day | ORAL | Status: DC

## 2015-04-23 NOTE — Assessment & Plan Note (Signed)
A: Shortness of breath: due to deconditioning or airway bronchospasm exacerbated by high heats  P:albuterol trial

## 2015-04-23 NOTE — Patient Instructions (Signed)
Ms. Axton,  Thank you for coming in today  1. Shortness of breath: albuterol trial  2. Weight gain: Increase physical activity, the recumbent bike would be a safe and effective exercise for you Look into silver sneakers   3. R ankle pain and stiffness: Tylenol #3 prescribed   F/u in 2 months for flu shot and to recheck weight   Dr. Adrian Blackwater

## 2015-04-23 NOTE — Assessment & Plan Note (Signed)
R ankle pain and stiffness: Tylenol #3 prescribed

## 2015-04-23 NOTE — Progress Notes (Signed)
F/U Medication refill Rz Albuterol Inh  Stated urine with strong Odor

## 2015-04-23 NOTE — Progress Notes (Signed)
   Subjective:    Patient ID: Megan Rose, female    DOB: [birth year], 64 y.o.   MRN: [id number] CC: SOB, R ankle pain, obesity  HPI 64 yo F with hx of breast cancer s/p mastectomy presents for f/u  1. SOB: comes and goes. For past 8 weeks. When she is outside. When it is hot and humid outside. With exertion. relieved wit rest. Patient has had radiation to chest in setting of breast cancer. Has not had similar symptoms before.   2. R ankle pain: chronic pain with mostly stiffness. This interferes with daily activies and has resulted in weight gain. She is distressed by her weight gain.  3. Obesity: with weight gain. Having SOB. Does not eat large meals. Is on a fixed income of $1800 per month. Would like to lose weight.   History  Substance Use Topics  . Smoking status: Former Research scientist (life sciences)  . Smokeless tobacco: Not on file  . Alcohol Use: Yes     Comment: occ maybe 3 times a year   Review of Systems  Constitutional: Negative for fever and chills.  Respiratory: Positive for shortness of breath.   Cardiovascular: Negative for chest pain.  Gastrointestinal: Negative for abdominal pain and blood in stool.  Musculoskeletal: Positive for arthralgias.  Skin: Negative for rash.  Psychiatric/Behavioral: Negative for suicidal ideas and dysphoric mood.       Objective:   Physical Exam .BP 131/81 mmHg  Pulse 76  Temp(Src) 98.3 F (36.8 C) (Oral)  Resp 16  Ht 5\' 1"  (1.549 m)  Wt 233 lb (105.688 kg)  BMI 44.05 kg/m2  SpO2 97%  Wt Readings from Last 3 Encounters:  04/23/15 233 lb (105.688 kg)  07/13/14 212 lb (96.163 kg)  05/25/14 205 lb (92.987 kg)  General appearance: alert, cooperative and no distress, moderately obese  Lungs: clear to auscultation bilaterally Heart: regular rate and rhythm, S1, S2 normal, no murmur, click, rub or gallop     Assessment & Plan:

## 2015-04-23 NOTE — Assessment & Plan Note (Signed)
Weight gain: in setting of sedentary lifestyle  Increase physical activity, the recumbent bike would be a safe and effective exercise for you Look into silver sneakers

## 2015-08-06 DIAGNOSIS — Z23 Encounter for immunization: Secondary | ICD-10-CM | POA: Diagnosis not present

## 2015-08-13 ENCOUNTER — Ambulatory Visit (HOSPITAL_COMMUNITY)
Admission: RE | Admit: 2015-08-13 | Discharge: 2015-08-13 | Disposition: A | Discharge status: Home / Self Care | Payer: Medicare Other | Source: Ambulatory Visit | Attending: Cardiology | Admitting: Cardiology

## 2015-08-13 ENCOUNTER — Other Ambulatory Visit: Payer: Self-pay

## 2015-08-13 ENCOUNTER — Ambulatory Visit (HOSPITAL_BASED_OUTPATIENT_CLINIC_OR_DEPARTMENT_OTHER): Payer: Medicare Other | Admitting: Family Medicine

## 2015-08-13 ENCOUNTER — Encounter: Payer: Self-pay | Admitting: Family Medicine

## 2015-08-13 VITALS — BP 127/80 | HR 97 | Temp 98.0°F | Resp 16 | Ht 61.0 in | Wt 234.0 lb

## 2015-08-13 DIAGNOSIS — R7303 Prediabetes: Secondary | ICD-10-CM

## 2015-08-13 DIAGNOSIS — Z87891 Personal history of nicotine dependence: Secondary | ICD-10-CM | POA: Diagnosis not present

## 2015-08-13 DIAGNOSIS — R0602 Shortness of breath: Secondary | ICD-10-CM | POA: Insufficient documentation

## 2015-08-13 DIAGNOSIS — I10 Essential (primary) hypertension: Secondary | ICD-10-CM

## 2015-08-13 DIAGNOSIS — Z23 Encounter for immunization: Secondary | ICD-10-CM

## 2015-08-13 DIAGNOSIS — Z79899 Other long term (current) drug therapy: Secondary | ICD-10-CM | POA: Diagnosis not present

## 2015-08-13 DIAGNOSIS — R0789 Other chest pain: Secondary | ICD-10-CM | POA: Insufficient documentation

## 2015-08-13 LAB — CBC
HEMATOCRIT: 47.3 % — AB (ref 36.0–46.0)
Hemoglobin: 16.2 g/dL — ABNORMAL HIGH (ref 12.0–15.0)
MCH: 34 pg (ref 26.0–34.0)
MCHC: 34.2 g/dL (ref 30.0–36.0)
MCV: 99.4 fL (ref 78.0–100.0)
MPV: 9.9 fL (ref 8.6–12.4)
Platelets: 312 10*3/uL (ref 150–400)
RBC: 4.76 MIL/uL (ref 3.87–5.11)
RDW: 12.6 % (ref 11.5–15.5)
WBC: 8.9 10*3/uL (ref 4.0–10.5)

## 2015-08-13 LAB — COMPLETE METABOLIC PANEL WITH GFR
ALBUMIN: 4.2 g/dL (ref 3.6–5.1)
ALT: 14 U/L (ref 6–29)
AST: 13 U/L (ref 10–35)
Alkaline Phosphatase: 85 U/L (ref 33–130)
BUN: 20 mg/dL (ref 7–25)
CALCIUM: 10 mg/dL (ref 8.6–10.4)
CHLORIDE: 101 mmol/L (ref 98–110)
CO2: 28 mmol/L (ref 20–31)
Creat: 0.98 mg/dL (ref 0.50–0.99)
GFR, EST NON AFRICAN AMERICAN: 61 mL/min (ref >=60)
GFR, Est African American: 70 mL/min (ref >=60)
Glucose, Bld: 109 mg/dL — ABNORMAL HIGH (ref 65–99)
POTASSIUM: 5.4 mmol/L — AB (ref 3.5–5.3)
Sodium: 139 mmol/L (ref 135–146)
Total Bilirubin: 0.6 mg/dL (ref 0.2–1.2)
Total Protein: 7.1 g/dL (ref 6.1–8.1)

## 2015-08-13 LAB — POCT GLYCOSYLATED HEMOGLOBIN (HGB A1C): Hemoglobin A1C: 6.3

## 2015-08-13 LAB — GLUCOSE, POCT (MANUAL RESULT ENTRY): POC Glucose: 114 mg/dl — AB (ref 70–99)

## 2015-08-13 MED ORDER — LISINOPRIL-HYDROCHLOROTHIAZIDE 20-25 MG PO TABS: 1.0000 | ORAL_TABLET | Freq: Every day | ORAL | Status: DC

## 2015-08-13 NOTE — Assessment & Plan Note (Signed)
A: SOB with chronic cough, worsening. Non smoker, hx of chemo and radiation for breast cancer  P: CXR CBC, pro BNP, BMP Consider ECHO

## 2015-08-13 NOTE — Patient Instructions (Addendum)
Hattie was seen today for hypertension.  Diagnoses and all orders for this visit:  Prediabetes -     POCT glycosylated hemoglobin (Hb A1C) -     POCT glucose (manual entry)  Left-sided chest wall pain -     DG Chest 2 View; Future  Shortness of breath -     DG Chest 2 View; Future -     CBC -     COMPLETE METABOLIC PANEL WITH GFR -     Pro b natriuretic peptide  Essential hypertension -     lisinopril-hydrochlorothiazide (PRINZIDE,ZESTORETIC) 20-25 MG tablet; Take 1 tablet by mouth daily.   Working up shortness of breath, chest pain appears to be non-cardiac with unchanged EKG. I suspect musculoskeletal pain.  Continue tylenol #3 and aleve for pain control.   Labs and x-ray ordered. ECHO may be needed.   F/u in 4 weeks for shortness of breath and chest wall pain  Dr. Adrian Blackwater

## 2015-08-13 NOTE — Assessment & Plan Note (Signed)
A: L sided chest wall pain. NSR on EKG w/o Q waves or ST changes P: CXR Pain control and aleve/tylenol #3 as needed

## 2015-08-13 NOTE — Progress Notes (Signed)
Patient ID: Megan Rose, female   DOB: May 05, 1951, 64 y.o.   MRN: [id number]   Subjective:  Patient ID: Megan Rose, female    DOB: Oct 07, 1950  Age: 64 y.o. MRN: [id number]  CC: Hypertension   HPI Megan Rose presents for     1. L sided chest pain: x 2 weeks. L lateral and posterior back pain. No recent trauma. Coughs "all the time" due to bad sinus drainage. Cough is productive in the mornings.  No fever. Has chronic SOB that is worsening. Trial of albuterol helped. However, she is SOB all the time even at rest. She has swelling in L leg. She has hx of breast cancer x 2. She is s/p bilateral mastectomy. Her most recent oncologist was Dr. Carolynn Sayers.   2. HTN: taking prinzide. Has cough. No oral swelling.    Social History  Substance Use Topics  . Smoking status: Former Research scientist (life sciences)  . Smokeless tobacco: Not on file  . Alcohol Use: Yes     Comment: occ maybe 3 times a year   Outpatient Prescriptions Prior to Visit  Medication Sig Dispense Refill  . acetaminophen-codeine (TYLENOL #3) 300-30 MG per tablet Take 1 tablet by mouth every 8 (eight) hours as needed for moderate pain. 60 tablet 0  . albuterol (PROVENTIL HFA;VENTOLIN HFA) 108 (90 BASE) MCG/ACT inhaler Inhale 2 puffs into the lungs every 6 (six) hours as needed for wheezing or shortness of breath. 1 Inhaler 2  . aspirin EC 325 MG tablet Take 325 mg by mouth 2 (two) times daily.    Marland Kitchen ibuprofen (ADVIL,MOTRIN) 100 MG tablet Take 440 mg by mouth 2 (two) times daily.     Marland Kitchen lisinopril-hydrochlorothiazide (PRINZIDE,ZESTORETIC) 20-25 MG per tablet Take 1 tablet by mouth daily. 30 tablet 3  . loratadine (CLARITIN) 10 MG tablet Take 10 mg by mouth daily as needed for allergies.    . Menthol-Methyl Salicylate (MUSCLE RUB) 10-15 % CREA Apply 1 application topically as needed for muscle pain.     No facility-administered medications prior to visit.    ROS Review of Systems  Constitutional: Negative for fever and chills.    Respiratory: Positive for shortness of breath.   Cardiovascular: Positive for chest pain.  Gastrointestinal: Negative for abdominal pain and blood in stool.  Musculoskeletal: Positive for arthralgias.  Skin: Negative for rash.  Psychiatric/Behavioral: Negative for suicidal ideas and dysphoric mood.    Objective:  BP 127/80 mmHg  Pulse 97  Temp(Src) 98 F (36.7 C) (Oral)  Resp 16  Ht 5\' 1"  (1.549 m)  Wt 234 lb (106.142 kg)  BMI 44.24 kg/m2  SpO2 96%  BP/Weight 08/13/2015 04/23/2015 [id number]  Systolic BP [id number]  Diastolic BP 80 81 84  Wt. (Lbs) 234 233 212  BMI 44.24 44.05 40.08    Physical Exam  Constitutional: She is oriented to person, place, and time. She appears well-developed and well-nourished. No distress.  HENT:  Head: Normocephalic and atraumatic.  Cardiovascular: Normal rate, regular rhythm, normal heart sounds and intact distal pulses.   Pulmonary/Chest: Effort normal and breath sounds normal.  S/p bilateral mastectomy   Musculoskeletal: She exhibits edema (trace edema in LLE ).  Neurological: She is alert and oriented to person, place, and time.  Skin: Skin is warm and dry. No rash noted.  Psychiatric: She has a normal mood and affect.   Lab Results  Component Value Date   HGBA1C 6.30 08/13/2015   CBG 114 (patient currently fasting)   EKG: normal EKG, normal  sinus rhythm, unchanged from previous tracings. Assessment & Plan:   Problem List Items Addressed This Visit    HTN (hypertension) (Chronic)   Relevant Medications   lisinopril-hydrochlorothiazide (PRINZIDE,ZESTORETIC) 20-25 MG tablet   Left-sided chest wall pain (Chronic)   Relevant Orders   DG Chest 2 View   Shortness of breath (Chronic)   Relevant Orders   DG Chest 2 View   CBC   COMPLETE METABOLIC PANEL WITH GFR   Pro b natriuretic peptide    Other Visit Diagnoses    Prediabetes    -  Primary    Relevant Orders    POCT glycosylated hemoglobin (Hb A1C) (Completed)    POCT  glucose (manual entry) (Completed)       No orders of the defined types were placed in this encounter.    Follow-up: No Follow-up on file.   Boykin Nearing MD

## 2015-08-13 NOTE — Progress Notes (Signed)
C/C SHOB, nausea, dizziness not often and pain lt side of torso, rib area   Pain scale #2 No tobacco user  No suicide thought in the past two week

## 2015-08-14 ENCOUNTER — Telehealth: Payer: Self-pay | Admitting: Family Medicine

## 2015-08-14 ENCOUNTER — Ambulatory Visit (HOSPITAL_COMMUNITY)
Admission: RE | Admit: 2015-08-14 | Discharge: 2015-08-14 | Disposition: A | Discharge status: Home / Self Care | Payer: Medicare Other | Source: Ambulatory Visit | Attending: Family Medicine | Admitting: Family Medicine

## 2015-08-14 ENCOUNTER — Ambulatory Visit: Payer: Medicare Other

## 2015-08-14 DIAGNOSIS — R0789 Other chest pain: Secondary | ICD-10-CM | POA: Insufficient documentation

## 2015-08-14 DIAGNOSIS — R0602 Shortness of breath: Secondary | ICD-10-CM

## 2015-08-14 DIAGNOSIS — Z9012 Acquired absence of left breast and nipple: Secondary | ICD-10-CM | POA: Diagnosis not present

## 2015-08-14 LAB — PRO B NATRIURETIC PEPTIDE: PRO B NATRI PEPTIDE: 31.01 pg/mL (ref <126)

## 2015-08-14 LAB — D-DIMER, QUANTITATIVE (NOT AT ARMC): D DIMER QUANT: 0.6 ug{FEU}/mL — AB (ref 0.00–0.48)

## 2015-08-14 NOTE — Telephone Encounter (Signed)
Called patient Gave labs results and CXR results  Plan for proBNP to check on SOB and CP.  Patient will come in today for lab check

## 2015-08-19 ENCOUNTER — Other Ambulatory Visit: Payer: Self-pay | Admitting: Family Medicine

## 2015-08-19 DIAGNOSIS — R0789 Other chest pain: Secondary | ICD-10-CM

## 2015-08-19 DIAGNOSIS — R0602 Shortness of breath: Secondary | ICD-10-CM

## 2015-08-21 ENCOUNTER — Telehealth: Payer: Self-pay | Admitting: *Deleted

## 2015-08-21 NOTE — Telephone Encounter (Signed)
-----   Message from Boykin Nearing, MD sent at 08/19/2015  9:36 AM EST ----- slightly elevated D dimer with CP and SOB CTA ordered and needs to be scheduled This a test that looks for clots in the lungs

## 2015-08-21 NOTE — Telephone Encounter (Signed)
Date of birth verified by pt  Lab results given  Elevated D dimer  CT appointment information given  Pt verbalized understanding

## 2015-08-21 NOTE — Telephone Encounter (Signed)
LVM to return call   CT appointment on 08/26/2015 at 2:00pm [facility]

## 2015-08-21 NOTE — Telephone Encounter (Signed)
Patient returned phone call. Please f/u  °

## 2015-08-26 ENCOUNTER — Ambulatory Visit (HOSPITAL_COMMUNITY)
Admission: RE | Admit: 2015-08-26 | Discharge: 2015-08-26 | Disposition: A | Discharge status: Home / Self Care | Payer: Medicare Other | Source: Ambulatory Visit | Attending: Family Medicine | Admitting: Family Medicine

## 2015-08-26 DIAGNOSIS — I2510 Atherosclerotic heart disease of native coronary artery without angina pectoris: Secondary | ICD-10-CM | POA: Insufficient documentation

## 2015-08-26 DIAGNOSIS — I517 Cardiomegaly: Secondary | ICD-10-CM | POA: Diagnosis not present

## 2015-08-26 DIAGNOSIS — I871 Compression of vein: Secondary | ICD-10-CM | POA: Diagnosis not present

## 2015-08-26 DIAGNOSIS — R0602 Shortness of breath: Secondary | ICD-10-CM | POA: Insufficient documentation

## 2015-08-26 DIAGNOSIS — R0789 Other chest pain: Secondary | ICD-10-CM | POA: Diagnosis not present

## 2015-08-26 DIAGNOSIS — D71 Functional disorders of polymorphonuclear neutrophils: Secondary | ICD-10-CM | POA: Diagnosis not present

## 2015-08-26 DIAGNOSIS — R791 Abnormal coagulation profile: Secondary | ICD-10-CM | POA: Insufficient documentation

## 2015-08-26 DIAGNOSIS — Z853 Personal history of malignant neoplasm of breast: Secondary | ICD-10-CM | POA: Diagnosis not present

## 2015-08-26 DIAGNOSIS — I700 Atherosclerosis of aorta: Secondary | ICD-10-CM | POA: Diagnosis not present

## 2015-08-26 DIAGNOSIS — R079 Chest pain, unspecified: Secondary | ICD-10-CM | POA: Diagnosis not present

## 2015-08-26 MED ORDER — IOHEXOL 350 MG/ML SOLN: 100.0000 mL | Freq: Once | INTRAVENOUS | Status: DC | PRN

## 2015-08-27 ENCOUNTER — Telehealth: Payer: Self-pay | Admitting: Family Medicine

## 2015-08-27 DIAGNOSIS — R0602 Shortness of breath: Secondary | ICD-10-CM

## 2015-08-27 DIAGNOSIS — M19071 Primary osteoarthritis, right ankle and foot: Secondary | ICD-10-CM

## 2015-08-27 DIAGNOSIS — Z9012 Acquired absence of left breast and nipple: Secondary | ICD-10-CM

## 2015-08-27 NOTE — Telephone Encounter (Signed)
Patient called to request the results from her CT scan, patient stated that she will leave out of town Friday and would like to know if she needs to stay.  Please f/u

## 2015-08-28 DIAGNOSIS — Z9010 Acquired absence of unspecified breast and nipple: Secondary | ICD-10-CM | POA: Insufficient documentation

## 2015-08-28 MED ORDER — IOHEXOL 350 MG/ML SOLN
100.0000 mL | Freq: Once | INTRAVENOUS | Status: AC | PRN
  Administered 2015-08-26: 100 mL via INTRAVENOUS

## 2015-08-28 NOTE — Telephone Encounter (Signed)
Patient called CTA negative for PE Heart is noted as enlarged on CTA,  there is no fluid around the heart. Heart size is noted as normal on recent CXR.  There are post-op changes to chest wall.  Patient is still SOB with minimal to moderate exertion. Using albuterol daily. Usual heat and humidity result in noticeable SOB and her breathing is better in the winter months.  Next step is pulm referral for pulmonary function testing   Patient agreed with plan and voiced understanding/

## 2015-09-10 ENCOUNTER — Ambulatory Visit: Payer: Medicare Other | Attending: Family Medicine | Admitting: Family Medicine

## 2015-09-10 ENCOUNTER — Encounter: Payer: Self-pay | Admitting: Family Medicine

## 2015-09-10 ENCOUNTER — Ambulatory Visit: Payer: Medicare Other | Admitting: Family Medicine

## 2015-09-10 VITALS — BP 125/77 | HR 87 | Temp 98.3°F | Resp 13 | Ht 61.0 in | Wt 232.0 lb

## 2015-09-10 DIAGNOSIS — Z880 Allergy status to penicillin: Secondary | ICD-10-CM | POA: Insufficient documentation

## 2015-09-10 DIAGNOSIS — Z79899 Other long term (current) drug therapy: Secondary | ICD-10-CM | POA: Insufficient documentation

## 2015-09-10 DIAGNOSIS — Z9012 Acquired absence of left breast and nipple: Secondary | ICD-10-CM | POA: Diagnosis not present

## 2015-09-10 DIAGNOSIS — Z7982 Long term (current) use of aspirin: Secondary | ICD-10-CM | POA: Insufficient documentation

## 2015-09-10 DIAGNOSIS — R0602 Shortness of breath: Secondary | ICD-10-CM | POA: Diagnosis not present

## 2015-09-10 DIAGNOSIS — R7303 Prediabetes: Secondary | ICD-10-CM

## 2015-09-10 DIAGNOSIS — R739 Hyperglycemia, unspecified: Secondary | ICD-10-CM | POA: Insufficient documentation

## 2015-09-10 DIAGNOSIS — I10 Essential (primary) hypertension: Secondary | ICD-10-CM | POA: Diagnosis not present

## 2015-09-10 DIAGNOSIS — Z87891 Personal history of nicotine dependence: Secondary | ICD-10-CM | POA: Diagnosis not present

## 2015-09-10 DIAGNOSIS — Z91030 Bee allergy status: Secondary | ICD-10-CM | POA: Diagnosis not present

## 2015-09-10 NOTE — Progress Notes (Signed)
CC: Follow-up on shortness of breath  HPI: Megan Rose is a 64 y.o. female with a history of hypertension, prediabetes who comes into the clinic for follow-up on shortness of breath which she has had for the last 2 months leading to having to use her albuterol inhaler more often. She had a natruretic peptide which came back negative, CT angiogram of the chest was negative for PE. She states she saw an herbal doctor in [place] who placed her on 'Lung Tonic' and 'Nita Sickle' which have brought about improvement in her shortness of breath and having to use albuterol inhaler less frequently. Shortness of breath is described as occurring when she moves her furniture or when she is stressed out but there has been no change in exercise tolerance, no pedal edema, no orthopnea, no weight gain, no paroxysmal dyspnea.  Patient has No headache, No chest pain, No abdominal pain - No Nausea, No new weakness tingling or numbness.  Allergies  Allergen Reactions  . Bee Venom Anaphylaxis  . Penicillins Anaphylaxis   Past Medical History  Diagnosis Date  . PONV (postoperative nausea and vomiting)   . Pneumonia   . Anemia Dx 2015  . Arthritis Dx 2015  . Cancer ([place]) 1991    Breast  . Hypertension Dx 1981   Current Outpatient Prescriptions on File Prior to Visit  Medication Sig Dispense Refill  . acetaminophen-codeine (TYLENOL #3) 300-30 MG per tablet Take 1 tablet by mouth every 8 (eight) hours as needed for moderate pain. 60 tablet 0  . albuterol (PROVENTIL HFA;VENTOLIN HFA) 108 (90 BASE) MCG/ACT inhaler Inhale 2 puffs into the lungs every 6 (six) hours as needed for wheezing or shortness of breath. 1 Inhaler 2  . lisinopril-hydrochlorothiazide (PRINZIDE,ZESTORETIC) 20-25 MG tablet Take 1 tablet by mouth daily. 30 tablet 3  . loratadine (CLARITIN) 10 MG tablet Take 10 mg by mouth daily as needed for allergies.    . naproxen sodium (ANAPROX) 220 MG tablet Take 220 mg by mouth 2 (two) times daily  with a meal.    . aspirin EC 325 MG tablet Take 325 mg by mouth 2 (two) times daily. Reported on 09/10/2015    . Menthol-Methyl Salicylate (MUSCLE RUB) 10-15 % CREA Apply 1 application topically as needed for muscle pain. Reported on 09/10/2015     No current facility-administered medications on file prior to visit.   Family History  Problem Relation Age of Onset  . Heart disease Mother   . Anuerysm Father   . Cancer Father   . Hypertension Daughter    Social History   Social History  . Marital Status: Widowed    Spouse Name: N/A  . Number of Children: N/A  . Years of Education: N/A   Occupational History  . Not on file.   Social History Main Topics  . Smoking status: Former Research scientist (life sciences)  . Smokeless tobacco: Not on file  . Alcohol Use: Yes     Comment: occ maybe 3 times a year  . Drug Use: No  . Sexual Activity: Not on file   Other Topics Concern  . Not on file   Social History Narrative    Review of Systems: Constitutional: Negative for fever, chills, diaphoresis, activity change, appetite change and fatigue. HENT: Negative for ear pain, nosebleeds, congestion, facial swelling, rhinorrhea, neck pain, neck stiffness and ear discharge.  Eyes: Negative for pain, discharge, redness, itching and visual disturbance. Respiratory: Negative for cough, choking, chest tightness, positive for shortness of breath, negative for  wheezing and stridor.  Cardiovascular: Negative for chest pain, palpitations and leg swelling. Gastrointestinal: Negative for abdominal distention. Genitourinary: Negative for dysuria, urgency, frequency, hematuria, flank pain, decreased urine volume, difficulty urinating and dyspareunia.  Musculoskeletal: Negative for back pain, joint swelling, arthralgias and gait problem. Neurological: Negative for dizziness, tremors, seizures, syncope, facial asymmetry, speech difficulty, weakness, light-headedness, numbness and headaches.  Hematological: Negative for  adenopathy. Does not bruise/bleed easily. Psychiatric/Behavioral: Negative for hallucinations, behavioral problems, confusion, dysphoric mood, decreased concentration and agitation.    Objective:   Filed Vitals:   09/10/15 1031  BP: 125/77  Pulse: 87  Temp: 98.3 F (36.8 C)  Resp: 13    Physical Exam: Constitutional: Patient appears well-developed and well-nourished. No distress. HENT: Normocephalic, atraumatic, External right and left ear normal. Oropharynx is clear and moist.  Eyes: Conjunctivae and EOM are normal. PERRLA, no scleral icterus. Neck: Normal ROM. No JVD. No tracheal deviation. No thyromegaly. CVS: RRR, S1/S2 +, no murmurs, no gallops, no carotid bruit.  Pulmonary: Effort and breath sounds normal, no stridor, rhonchi, wheezes, rales.  Abdominal: Soft. BS +,  no distension, tenderness, rebound or guarding.  Musculoskeletal: Normal range of motion. No edema and no tenderness.  Lymphadenopathy: No lymphadenopathy noted, cervical, inguinal or axillary Neuro: Alert. Normal reflexes, muscle tone coordination. No cranial nerve deficit. Skin: Skin is warm and dry. No rash noted. Not diaphoretic. No erythema. No pallor. Psychiatric: Normal mood and affect. Behavior, judgment, thought content normal.  Lab Results  Component Value Date   WBC 8.9 08/13/2015   HGB 16.2* 08/13/2015   HCT 47.3* 08/13/2015   MCV 99.4 08/13/2015   PLT 312 08/13/2015   Lab Results  Component Value Date   CREATININE 0.98 08/13/2015   BUN 20 08/13/2015   NA 139 08/13/2015   K 5.4* 08/13/2015   CL 101 08/13/2015   CO2 28 08/13/2015    Lab Results  Component Value Date   HGBA1C 6.30 08/13/2015      CLINICAL DATA: 64 year old female left side chest pain, shortness of breath. Abnormal D-dimer. Initial encounter. Personal history of breast cancer.  EXAM: CT ANGIOGRAPHY CHEST WITH CONTRAST  TECHNIQUE: Multidetector CT imaging of the chest was performed using the standard protocol  during bolus administration of intravenous contrast. Multiplanar CT image reconstructions and MIPs were obtained to evaluate the vascular anatomy.  CONTRAST: 100 mL Omnipaque 350  COMPARISON: Chest radiographs 08/14/2015 and earlier.  FINDINGS: Suboptimal but adequate contrast bolus timing in the pulmonary arterial tree. However, there is intermittent motion artifact. This limits evaluation of some distal branches, especially in the right upper lobe. No pulmonary artery filling defect identified.  Postoperative changes to the anterior chest wall with mild scarring on the right. Right subclavian vein stenosis with enhancing collaterals about the right shoulder and right lower cervical spine. Surgical clips at the left axilla. No axillary lymphadenopathy.  Cardiomegaly. No pericardial effusion. Calcified aortic and coronary artery atherosclerosis. No mediastinal or hilar lymphadenopathy. There are small calcified mediastinal and hilar nodes mostly on the left. There are numerous small calcified granulomas in the visible liver and spleen.  Major airways are patent. Mosaic attenuation in the upper lobes, may be exacerbated by motion artifact. No other abnormal pulmonary opacity identified.  Gallbladder, visualized pancreas, left adrenal gland, kidneys, and bowel in the upper abdomen are within normal limits. There is a right adrenal gland adenoma measuring up to 2.3 cm (-8 Hounsfield units).  No acute osseous abnormality identified.  Review of the MIP images confirms  the above findings.  IMPRESSION: 1. Suboptimal, related to right subclavian vein stenosis and intermittent respiratory motion. No evidence of acute pulmonary embolus. 2. Mosaic attenuation in the upper lobes such as can be seen with small airway or small vessel disease. Otherwise no acute pulmonary findings identified. 3. Calcified aortic and coronary artery atherosclerosis. Mild cardiomegaly. 4.  Chronic granulomatous disease. Postoperative changes to the chest wall and left axilla.   Electronically Signed  By: Genevie Ann M.D.  On: 08/26/2015 15:45  Assessment and plan:  Prediabetes: A1c of 6.3. Currently on diet control.  Hypertension: Controlled. Continue antihypertensives  Shortness of breath: Patient reports symptoms have improved. Workup so far has been unrevealing-CTA angiography of the chest negative for PE, natriuretic peptide negative for heart failure. Oxygen saturation is 98 in the clinic today and clinical exam does not support a diagnosis of heart failure and so I will hold off on ordering a 2-D echo. She has an upcoming appointment with pulmonary and PFT on 12/2016which I have advised her to keep. Advised to refrain from using herbal medicines.      Arnoldo Morale, [facility] [phone] 09/10/2015, 10:43 AM

## 2015-09-10 NOTE — Progress Notes (Signed)
Patient is not sure why she is here She states she was called yesterday with an appointment reminder She has pulmonolgy appointment on 12/30-self report She is taking 2 herbal medications for her lung function She feels her breathing is no better

## 2015-09-20 ENCOUNTER — Institutional Professional Consult (permissible substitution): Payer: Medicare Other | Admitting: Internal Medicine

## 2015-09-22 ENCOUNTER — Emergency Department (INDEPENDENT_AMBULATORY_CARE_PROVIDER_SITE_OTHER): Payer: Medicare Other

## 2015-09-22 ENCOUNTER — Encounter (HOSPITAL_COMMUNITY): Payer: Self-pay | Admitting: *Deleted

## 2015-09-22 ENCOUNTER — Emergency Department (INDEPENDENT_AMBULATORY_CARE_PROVIDER_SITE_OTHER)
Admission: EM | Admit: 2015-09-22 | Discharge: 2015-09-22 | Disposition: A | Discharge status: Home / Self Care | Payer: Medicare Other | Source: Home / Self Care

## 2015-09-22 DIAGNOSIS — J40 Bronchitis, not specified as acute or chronic: Secondary | ICD-10-CM

## 2015-09-22 DIAGNOSIS — J069 Acute upper respiratory infection, unspecified: Secondary | ICD-10-CM | POA: Diagnosis not present

## 2015-09-22 DIAGNOSIS — R05 Cough: Secondary | ICD-10-CM | POA: Diagnosis not present

## 2015-09-22 DIAGNOSIS — R509 Fever, unspecified: Secondary | ICD-10-CM | POA: Diagnosis not present

## 2015-09-22 LAB — POCT RAPID STREP A: STREPTOCOCCUS, GROUP A SCREEN (DIRECT): NEGATIVE

## 2015-09-22 MED ORDER — AZITHROMYCIN 250 MG PO TABS: ORAL_TABLET | ORAL | Status: DC

## 2015-09-22 NOTE — ED Notes (Signed)
C/O cough, fevers up to 103, severe sore throat, sinus congestion and drainage since 12/27.  Has been using cough syrup, Severe Cold & Sinus med, Mucinex, ASA, Proair.  C/O pain around mid back and ribcage from coughing.

## 2015-09-22 NOTE — Discharge Instructions (Signed)
Upper Respiratory Infection, Adult Most upper respiratory infections (URIs) are a viral infection of the air passages leading to the lungs. A URI affects the nose, throat, and upper air passages. The most common type of URI is nasopharyngitis and is typically referred to as "the common cold." URIs run their course and usually go away on their own. Most of the time, a URI does not require medical attention, but sometimes a bacterial infection in the upper airways can follow a viral infection. This is called a secondary infection. Sinus and middle ear infections are common types of secondary upper respiratory infections. Bacterial pneumonia can also complicate a URI. A URI can worsen asthma and chronic obstructive pulmonary disease (COPD). Sometimes, these complications can require emergency medical care and may be life threatening.  CAUSES Almost all URIs are caused by viruses. A virus is a type of germ and can spread from one person to another.  RISKS FACTORS You may be at risk for a URI if:   You smoke.   You have chronic heart or lung disease.  You have a weakened defense (immune) system.   You are very young or very old.   You have nasal allergies or asthma.  You work in crowded or poorly ventilated areas.  You work in health care facilities or schools. SIGNS AND SYMPTOMS  Symptoms typically develop 2-3 days after you come in contact with a cold virus. Most viral URIs last 7-10 days. However, viral URIs from the influenza virus (flu virus) can last 14-18 days and are typically more severe. Symptoms may include:   Runny or stuffy (congested) nose.   Sneezing.   Cough.   Sore throat.   Headache.   Fatigue.   Fever.   Loss of appetite.   Pain in your forehead, behind your eyes, and over your cheekbones (sinus pain).  Muscle aches.  DIAGNOSIS  Your health care provider may diagnose a URI by:  Physical exam.  Tests to check that your symptoms are not due to  another condition such as:  Strep throat.  Sinusitis.  Pneumonia.  Asthma. TREATMENT  A URI goes away on its own with time. It cannot be cured with medicines, but medicines may be prescribed or recommended to relieve symptoms. Medicines may help:  Reduce your fever.  Reduce your cough.  Relieve nasal congestion. HOME CARE INSTRUCTIONS   Take medicines only as directed by your health care provider.   Gargle warm saltwater or take cough drops to comfort your throat as directed by your health care provider.  Use a warm mist humidifier or inhale steam from a shower to increase air moisture. This may make it easier to breathe.  Drink enough fluid to keep your urine clear or pale yellow.   Eat soups and other clear broths and maintain good nutrition.   Rest as needed.   Return to work when your temperature has returned to normal or as your health care provider advises. You may need to stay home longer to avoid infecting others. You can also use a face mask and careful hand washing to prevent spread of the virus.  Increase the usage of your inhaler if you have asthma.   Do not use any tobacco products, including cigarettes, chewing tobacco, or electronic cigarettes. If you need help quitting, ask your health care provider. PREVENTION  The best way to protect yourself from getting a cold is to practice good hygiene.   Avoid oral or hand contact with people with cold  symptoms.   Wash your hands often if contact occurs.  There is no clear evidence that vitamin C, vitamin E, echinacea, or exercise reduces the chance of developing a cold. However, it is always recommended to get plenty of rest, exercise, and practice good nutrition.  SEEK MEDICAL CARE IF:   You are getting worse rather than better.   Your symptoms are not controlled by medicine.   You have chills.  You have worsening shortness of breath.  You have brown or red mucus.  You have yellow or brown nasal  discharge.  You have pain in your face, especially when you bend forward.  You have a fever.  You have swollen neck glands.  You have pain while swallowing.  You have white areas in the back of your throat. SEEK IMMEDIATE MEDICAL CARE IF:   You have severe or persistent:  Headache.  Ear pain.  Sinus pain.  Chest pain.  You have chronic lung disease and any of the following:  Wheezing.  Prolonged cough.  Coughing up blood.  A change in your usual mucus.  You have a stiff neck.  You have changes in your:  Vision.  Hearing.  Thinking.  Mood. MAKE SURE YOU:   Understand these instructions.  Will watch your condition.  Will get help right away if you are not doing well or get worse.   This information is not intended to replace advice given to you by your health care provider. Make sure you discuss any questions you have with your health care provider.   Document Released: 03/03/2001 Document Revised: 01/22/2015 Document Reviewed: 12/13/2013 Elsevier Interactive Patient Education 2016 [employer] Acute Bronchitis Bronchitis is when the airways that extend from the windpipe into the lungs get red, puffy, and painful (inflamed). Bronchitis often causes thick spit (mucus) to develop. This leads to a cough. A cough is the most common symptom of bronchitis. In acute bronchitis, the condition usually begins suddenly and goes away over time (usually in 2 weeks). Smoking, allergies, and asthma can make bronchitis worse. Repeated episodes of bronchitis may cause more lung problems. HOME CARE  Rest.  Drink enough fluids to keep your pee (urine) clear or pale yellow (unless you need to limit fluids as told by your doctor).  Only take over-the-counter or prescription medicines as told by your doctor.  Avoid smoking and secondhand smoke. These can make bronchitis worse. If you are a smoker, think about using nicotine gum or skin patches. Quitting smoking will help  your lungs heal faster.  Reduce the chance of getting bronchitis again by:  Washing your hands often.  Avoiding people with cold symptoms.  Trying not to touch your hands to your mouth, nose, or eyes.  Follow up with your doctor as told. GET HELP IF: Your symptoms do not improve after 1 week of treatment. Symptoms include:  Cough.  Fever.  Coughing up thick spit.  Body aches.  Chest congestion.  Chills.  Shortness of breath.  Sore throat. GET HELP RIGHT AWAY IF:   You have an increased fever.  You have chills.  You have severe shortness of breath.  You have bloody thick spit (sputum).  You throw up (vomit) often.  You lose too much body fluid (dehydration).  You have a severe headache.  You faint. MAKE SURE YOU:   Understand these instructions.  Will watch your condition.  Will get help right away if you are not doing well or get worse.   This information is  not intended to replace advice given to you by your health care provider. Make sure you discuss any questions you have with your health care provider.   Document Released: 02/24/2008 Document Revised: 05/10/2013 Document Reviewed: 02/28/2013 Elsevier Interactive Patient Education [employer].

## 2015-09-22 NOTE — ED Provider Notes (Signed)
CSN: [id number]     Arrival date & time 09/22/15  1358 History   None    Chief Complaint  Patient presents with  . Cough  . Fever   (Consider location/radiation/quality/duration/timing/severity/associated sxs/prior Treatment) HPI URI symptoms with cough, congestion, sore throat for the last 5 days. OTC meds are not helping.  Past Medical History  Diagnosis Date  . PONV (postoperative nausea and vomiting)   . Pneumonia   . Anemia Dx 2015  . Arthritis Dx 2015  . Hypertension Dx 1981  . Cancer ([place]) 1991    Breast   Past Surgical History  Procedure Laterality Date  . Mastectomy, radical Bilateral     1991 initial cancer, second round of cancer treated by Dr. Carolynn Sayers   . Abdominal hysterectomy    . Tubal ligation    . Knee surgery Right 1972  . Cosmetic surgery Left     left chest after mastectomy  . Ankle arthroscopy Right 05/25/2014    Procedure: Right Ankle and Subtalar Arthroscopy ;  Surgeon: Newt Minion, MD;  Location: [place];  Service: Orthopedics;  Laterality: Right;  . Breast surgery    . Simple mastectomy      right  . Mastectomy, radical      left   Family History  Problem Relation Age of Onset  . Heart disease Mother   . Anuerysm Father   . Cancer Father   . Hypertension Daughter    Social History  Substance Use Topics  . Smoking status: Former Research scientist (life sciences)  . Smokeless tobacco: Former Systems developer    Quit date: 09/22/1979  . Alcohol Use: Yes     Comment: rarely   OB History    No data available     Review of Systems ROS +'ve congestion, URI symptoms.  Denies: HEADACHE, NAUSEA, ABDOMINAL PAIN, CHEST PAIN,   DYSURIA, SHORTNESS OF BREATH  Allergies  Bee venom; Penicillins; and Codeine  Home Medications   Prior to Admission medications   Medication Sig Start Date End Date Taking? Authorizing Provider  albuterol (PROVENTIL HFA;VENTOLIN HFA) 108 (90 BASE) MCG/ACT inhaler Inhale 2 puffs into the lungs every 6 (six) hours as needed for wheezing or shortness of  breath. 04/23/15  Yes Boykin Nearing, MD  aspirin 81 MG tablet Take 81 mg by mouth daily.   Yes Historical Provider, MD  lisinopril-hydrochlorothiazide (PRINZIDE,ZESTORETIC) 20-25 MG tablet Take 1 tablet by mouth daily. 08/13/15  Yes Josalyn Funches, MD  loratadine (CLARITIN) 10 MG tablet Take 10 mg by mouth daily as needed for allergies.   Yes Historical Provider, MD  Misc Natural Products (LUNG TONIC) LIQD Take 20 drops by mouth every other day.   Yes Historical Provider, MD  naproxen sodium (ANAPROX) 220 MG tablet Take 220 mg by mouth 2 (two) times daily with a meal.   Yes Historical Provider, MD  acetaminophen-codeine (TYLENOL #3) 300-30 MG per tablet Take 1 tablet by mouth every 8 (eight) hours as needed for moderate pain. 04/23/15   Boykin Nearing, MD  aspirin EC 325 MG tablet Take 325 mg by mouth 2 (two) times daily. Reported on 09/10/2015    Historical Provider, MD  Menthol-Methyl Salicylate (MUSCLE RUB) 10-15 % CREA Apply 1 application topically as needed for muscle pain. Reported on 09/10/2015    Historical Provider, MD   Meds Ordered and Administered this Visit  Medications - No data to display  BP 131/64 mmHg  Pulse 83  Temp(Src) 98.2 F (36.8 C) (Oral)  Resp 24  SpO2  98% No data found.   Physical Exam  Constitutional: She is oriented to person, place, and time. She appears well-developed and well-nourished.  HENT:  Head: Normocephalic and atraumatic.  Right Ear: External ear normal.  Left Ear: External ear normal.  Nose: Nose normal.  Mouth/Throat: Oropharynx is clear and moist.  Eyes: Conjunctivae are normal.  Neck: Normal range of motion. Neck supple.  Cardiovascular: Normal rate.   Pulmonary/Chest: Effort normal and breath sounds normal.  Abdominal: Soft.  Musculoskeletal: Normal range of motion.  Neurological: She is alert and oriented to person, place, and time.  Skin: Skin is warm and dry.  Psychiatric: She has a normal mood and affect. Her behavior is normal.  Thought content normal.  Nursing note and vitals reviewed.   ED Course  Procedures (including critical care time)  Labs Review Labs Reviewed  POCT RAPID STREP A    Imaging Review Dg Chest 2 View  09/22/2015  CLINICAL DATA:  Cough with fever and throat pain. EXAM: CHEST  2 VIEW COMPARISON:  08/14/2015 FINDINGS: The lungs are clear wiithout focal pneumonia, edema, pneumothorax or pleural effusion. Interstitial markings are diffusely coarsened with chronic features. Cardiopericardial silhouette is at upper limits of normal for size. The visualized bony structures of the thorax are intact. IMPRESSION: Stable. Interstitial coarsening without acute cardiopulmonary findings. Electronically Signed   By: Misty Stanley M.D.   On: 09/22/2015 16:57     Visual Acuity Review  Right Eye Distance:   Left Eye Distance:   Bilateral Distance:    Right Eye Near:   Left Eye Near:    Bilateral Near:         MDM   1. Acute URI   2. Bronchitis    Patient is advised to continue home symptomatic treatment. Prescription for zpak sent pharmacy patient has indicated. Patient is advised that if there are new or worsening symptoms or attend the emergency department, or contact primary care provider. Instructions of care provided discharged home in stable condition.  THIS NOTE WAS GENERATED USING A VOICE RECOGNITION SOFTWARE PROGRAM. ALL REASONABLE EFFORTS  WERE MADE TO PROOFREAD THIS DOCUMENT FOR ACCURACY.     Konrad Felix, Regino Ramirez 09/22/15 1742

## 2015-09-25 LAB — CULTURE, GROUP A STREP: STREP A CULTURE: NEGATIVE

## 2015-10-03 ENCOUNTER — Ambulatory Visit (INDEPENDENT_AMBULATORY_CARE_PROVIDER_SITE_OTHER): Payer: Medicare Other | Admitting: Internal Medicine

## 2015-10-03 ENCOUNTER — Telehealth: Payer: Self-pay | Admitting: Internal Medicine

## 2015-10-03 ENCOUNTER — Encounter: Payer: Self-pay | Admitting: Internal Medicine

## 2015-10-03 VITALS — BP 124/80 | HR 90 | Ht 60.0 in | Wt 238.0 lb

## 2015-10-03 DIAGNOSIS — I10 Essential (primary) hypertension: Secondary | ICD-10-CM

## 2015-10-03 DIAGNOSIS — R058 Other specified cough: Secondary | ICD-10-CM

## 2015-10-03 DIAGNOSIS — R05 Cough: Secondary | ICD-10-CM | POA: Diagnosis not present

## 2015-10-03 MED ORDER — VALSARTAN-HYDROCHLOROTHIAZIDE 160-25 MG PO TABS: 1.0000 | ORAL_TABLET | Freq: Every day | ORAL | Status: AC

## 2015-10-03 NOTE — Progress Notes (Signed)
Subjective:     Patient ID: Megan Rose, female   DOB: 01/27/1951,   MRN: [id number]  HPI  63 yowf quit smoking 1981 with tendency to resp infections that seemed better from 2011 - 2016 then abruptly Oct late 2016 sob/cough referred to pulmonary clinic 10/03/2015   by Dr Adrian Blackwater p no improvement on ventolin / zpak   10/03/2015 [facility]st [facility] office visit/ Ronnika Collett   Chief Complaint  Patient presents with  . Pulmonary Consult    Referred by Dr. Adrian Blackwater. Pt c/o cough, SOB, sore ribs x 2-3 months. She was dxed with Bronchitis on 09/22/15 and txed with Zpack. Her cough has lessened some since abx. She notices she coughs more after she drinks something, talks alot and in the am's.    does fine  With doe if  holding onto cart even at big store like [employer]  Cough is day > noct, min productive s excess/ purulent sputum or mucus plugs  / worse with talking/ assoc with gen bilateral cw pain from coughing fits   No obvious day to day or daytime variability or assoc    chest tightness, subjective wheeze or overt sinus or hb symptoms. No unusual exp hx or h/o childhood pna/ asthma or knowledge of premature birth.  Sleeping ok without nocturnal  or early am exacerbation  of respiratory  c/o's or need for noct saba. Also denies any obvious fluctuation of symptoms with weather or environmental changes or other aggravating or alleviating factors except as outlined above   Current Medications, Allergies, Complete Past Medical History, Past Surgical History, Family History, and Social History were reviewed in [employer] record.  ROS  The following are not active complaints unless bolded sore throat, dysphagia, dental problems, itching, sneezing,  nasal congestion or excess/ purulent secretions, ear ache,   fever, chills, sweats, unintended wt loss, classically exertional cp, hemoptysis,  orthopnea pnd or leg swelling, presyncope, palpitations, abdominal pain, anorexia, nausea,  vomiting, diarrhea  or change in bowel or bladder habits, change in stools or urine, dysuria,hematuria,  rash, arthralgias, visual complaints, headache, numbness, weakness or ataxia or problems with walking or coordination,  change in mood/affect or memory.              Review of Systems     Objective:   Physical Exam    amb hoarse wf nad  Wt Readings from Last 3 Encounters:  10/03/15 238 lb (107.956 kg)  09/10/15 232 lb (105.235 kg)  08/13/15 234 lb (106.142 kg)    Vital signs reviewed     HEENT: nl dentition, turbinates, and oropharynx. Nl external ear canals without cough reflex   NECK :  without JVD/Nodes/TM/ nl carotid upstrokes bilaterally   LUNGS: no acc muscle use,  Nl contour chest which is clear to A and P bilaterally without cough on insp or exp maneuvers   CV:  RRR  no s3 or murmur or increase in P2, no edema   ABD:  soft and nontender with nl inspiratory excursion in the supine position. No bruits or organomegaly, bowel sounds nl  MS:  Nl gait/ ext warm without deformities, calf tenderness, cyanosis or clubbing No obvious joint restrictions   SKIN: warm and dry without lesions    NEURO:  alert, approp, nl sensorium with  no motor deficits    I personally reviewed images and agree with radiology impression as follows:  CXR:  09/22/15 Stable. Interstitial coarsening without acute cardiopulmonary findings.  Assessment:

## 2015-10-03 NOTE — Patient Instructions (Addendum)
For drainage / throat tickle try take CHLORPHENIRAMINE  4 mg - take one every 4 hours as needed - available over the counter- may cause drowsiness so start with just a bedtime dose or two and see how you tolerate it before trying in daytime    Try prilosec otc 20mg   Take 30-60 min before first meal of the day and Pepcid ac (famotidine) 20 mg one @  bedtime until cough is completely gone for at least a week without the need for cough suppression  GERD (REFLUX)  is an extremely common cause of respiratory symptoms just like yours , many times with no obvious heartburn at all.    It can be treated with medication, but also with lifestyle changes including elevation of the head of your bed (ideally with 6 inch  bed blocks),  Smoking cessation, avoidance of late meals, excessive alcohol, and avoid fatty foods, chocolate, peppermint, colas, red wine, and acidic juices such as orange juice.  NO MINT OR MENTHOL PRODUCTS SO NO COUGH DROPS  USE SUGARLESS CANDY INSTEAD (Jolley ranchers or Stover's or Life Savers) or even ice chips will also do - the key is to swallow to prevent all throat clearing. NO OIL BASED VITAMINS - use powdered substitutes.    Stop lisinopril and replace with with valsartan 160 - 25 one daily and your symptoms should dissipate over dissipate over the next 6 weeks   If you are satisfied with your treatment plan,  let your doctor know and he/she can either refill your medications or you can return here when your prescription runs out.     If in any way you are not 100% satisfied,  please tell us.  If 100% better, tell your friends!  Pulmonary follow up is as needed

## 2015-10-03 NOTE — Telephone Encounter (Signed)
Pt was wondering if Dr. Ronnald Ramp will take her on as a new pt, pt was refer from Dr. Melvyn Novas. Please advise and call pt back

## 2015-10-04 ENCOUNTER — Encounter: Payer: Self-pay | Admitting: Internal Medicine

## 2015-10-04 DIAGNOSIS — R05 Cough: Secondary | ICD-10-CM | POA: Insufficient documentation

## 2015-10-04 DIAGNOSIS — R058 Other specified cough: Secondary | ICD-10-CM | POA: Insufficient documentation

## 2015-10-04 DIAGNOSIS — I10 Essential (primary) hypertension: Secondary | ICD-10-CM | POA: Insufficient documentation

## 2015-10-04 NOTE — Assessment & Plan Note (Signed)
In the best review of chronic cough to date ( NEJM 2016 375 [phone]) ,  ACEi are now felt to cause cough in up to  20% of pts which is a 4 fold increase from previous reports and does not include the variety of non-specific complaints we see in pulmonary clinic in pts on ACEi but previously attributed to copd/asthma to include PNDS, throat and chest congestion, "bronchitis", unexplained dyspnea and noct "strangling" sensations as well as atypical /refractory GERD symptoms like atypical dysphagia.   The only way to prove this is not an "ACEi Case" is a trial off ACEi x a minimum of 6 weeks then regroup.   rec try diovan 160-25 and return here in 6 weeks if not all better, back to primary care if satisfied with response

## 2015-10-04 NOTE — Assessment & Plan Note (Addendum)
Classic Upper airway cough syndrome, so named because it's frequently impossible to sort out how much is  CR/sinusitis with freq throat clearing (which can be related to primary GERD)   vs  causing  secondary (" extra esophageal")  GERD from wide swings in gastric pressure that occur with throat clearing, often  promoting self use of mint and menthol lozenges that reduce the lower esophageal sphincter tone and exacerbate the problem further in a cyclical fashion.   These are the same pts (now being labeled as having "irritable larynx syndrome" by some cough centers) who not infrequently have a history of having failed to tolerate ace inhibitors,  dry powder inhalers or biphosphonates or report having atypical reflux symptoms that don't respond to standard doses of PPI , and are easily confused as having aecopd or asthma flares by even experienced allergists/ pulmonologists.   First step is trial off acei and max short term  rx for GERD/pnds then return in 6 weeks if not better.   Explained to Pt: The standardized cough guidelines published in Chest by Lissa Morales in 2006 are still the best available and consist of a multiple step process (up to 12!) , not a single office visit,  and are intended  to address this problem logically,  with an alogrithm dependent on response to empiric treatment at  each progressive step  to determine a specific diagnosis with  minimal addtional testing needed. Therefore if adherence is an issue or can't be accurately verified,  it's very unlikely the standard evaluation and treatment will be successful here.    Furthermore, response to therapy (other than acute cough suppression, which should only be used short term with avoidance of narcotic containing cough syrups if possible), can be a gradual process for which the patient may perceive immediate benefit.  Unlike going to an eye doctor where the best perscription is almost always the first one and is immediately  effective, this is almost never the case in the management of chronic cough syndromes. Therefore the patient needs to commit up front to consistently adhere to recommendations  for up to 6 weeks of therapy directed at the likely underlying problem(s) before the response can be reasonably evaluated.   I had an extended discussion with the patient reviewing all relevant studies completed to date and  lasting 35 minutes of a 60 minute initial office visit    Each maintenance medication was reviewed in detail including most importantly the difference between maintenance and prns and under what circumstances the prns are to be triggered using an action plan format that is not reflected in the computer generated alphabetically organized AVS.    Please see instructions for details which were reviewed in writing and the patient given a copy highlighting the part that I personally wrote and discussed at today's ov.

## 2015-10-04 NOTE — Assessment & Plan Note (Signed)
Body mass index is 46.48 No results found for: TSH   Contributing to gerd tendency/ doe/reviewed the need and the process to achieve and maintain neg calorie balance > defer f/u primary care including intermittently monitoring thyroid status

## 2015-10-07 NOTE — Telephone Encounter (Signed)
Left vm for pt to call back and schedule an appt with Dr. Jones.  °

## 2015-10-07 NOTE — Telephone Encounter (Signed)
yes

## 2015-10-22 ENCOUNTER — Other Ambulatory Visit (INDEPENDENT_AMBULATORY_CARE_PROVIDER_SITE_OTHER): Payer: Medicare Other

## 2015-10-22 ENCOUNTER — Encounter: Payer: Self-pay | Admitting: Internal Medicine

## 2015-10-22 ENCOUNTER — Ambulatory Visit (INDEPENDENT_AMBULATORY_CARE_PROVIDER_SITE_OTHER): Payer: Medicare Other | Admitting: Internal Medicine

## 2015-10-22 VITALS — BP 130/80 | HR 87 | Temp 97.9°F | Resp 16 | Ht 60.0 in | Wt 229.0 lb

## 2015-10-22 DIAGNOSIS — I10 Essential (primary) hypertension: Secondary | ICD-10-CM

## 2015-10-22 DIAGNOSIS — R739 Hyperglycemia, unspecified: Secondary | ICD-10-CM | POA: Diagnosis not present

## 2015-10-22 DIAGNOSIS — E785 Hyperlipidemia, unspecified: Secondary | ICD-10-CM | POA: Diagnosis not present

## 2015-10-22 DIAGNOSIS — Z1159 Encounter for screening for other viral diseases: Secondary | ICD-10-CM | POA: Diagnosis not present

## 2015-10-22 LAB — CBC WITH DIFFERENTIAL/PLATELET
BASOS ABS: 0.1 10*3/uL (ref 0.0–0.1)
Basophils Relative: 1.5 % (ref 0.0–3.0)
EOS ABS: 0.1 10*3/uL (ref 0.0–0.7)
Eosinophils Relative: 1.2 % (ref 0.0–5.0)
HEMATOCRIT: 46.3 % — AB (ref 36.0–46.0)
HEMOGLOBIN: 15.8 g/dL — AB (ref 12.0–15.0)
LYMPHS PCT: 28.5 % (ref 12.0–46.0)
Lymphs Abs: 2.5 10*3/uL (ref 0.7–4.0)
MCHC: 34.1 g/dL (ref 30.0–36.0)
MCV: 98.1 fl (ref 78.0–100.0)
Monocytes Absolute: 0.5 10*3/uL (ref 0.1–1.0)
Monocytes Relative: 6.2 % (ref 3.0–12.0)
Neutro Abs: 5.4 10*3/uL (ref 1.4–7.7)
Neutrophils Relative %: 62.6 % (ref 43.0–77.0)
PLATELETS: 311 10*3/uL (ref 150.0–400.0)
RBC: 4.72 Mil/uL (ref 3.87–5.11)
RDW: 12.9 % (ref 11.5–15.5)
WBC: 8.6 10*3/uL (ref 4.0–10.5)

## 2015-10-22 LAB — COMPREHENSIVE METABOLIC PANEL
ALT: 16 U/L (ref 0–35)
AST: 17 U/L (ref 0–37)
Albumin: 4.3 g/dL (ref 3.5–5.2)
Alkaline Phosphatase: 77 U/L (ref 39–117)
BILIRUBIN TOTAL: 0.4 mg/dL (ref 0.2–1.2)
BUN: 19 mg/dL (ref 6–23)
CO2: 30 mEq/L (ref 19–32)
CREATININE: 0.99 mg/dL (ref 0.40–1.20)
Calcium: 9.6 mg/dL (ref 8.4–10.5)
Chloride: 102 mEq/L (ref 96–112)
GFR: 59.79 mL/min — AB (ref >60.00)
GLUCOSE: 123 mg/dL — AB (ref 70–99)
POTASSIUM: 3.9 meq/L (ref 3.5–5.1)
Sodium: 140 mEq/L (ref 135–145)
TOTAL PROTEIN: 7.6 g/dL (ref 6.0–8.3)

## 2015-10-22 LAB — TSH: TSH: 2.67 u[IU]/mL (ref 0.35–4.50)

## 2015-10-22 LAB — LIPID PANEL
CHOL/HDL RATIO: 5
Cholesterol: 222 mg/dL — ABNORMAL HIGH (ref 0–200)
HDL: 48 mg/dL (ref >39.00)
NONHDL: 174.33
TRIGLYCERIDES: 227 mg/dL — AB (ref 0.0–149.0)
VLDL: 45.4 mg/dL — ABNORMAL HIGH (ref 0.0–40.0)

## 2015-10-22 LAB — HM MAMMOGRAPHY

## 2015-10-22 LAB — HEMOGLOBIN A1C: HEMOGLOBIN A1C: 6.2 % (ref 4.6–6.5)

## 2015-10-22 LAB — LDL CHOLESTEROL, DIRECT: LDL DIRECT: 140 mg/dL

## 2015-10-22 MED ORDER — OMEPRAZOLE 40 MG PO CPDR: 40.0000 mg | DELAYED_RELEASE_CAPSULE | Freq: Every day | ORAL | Status: AC

## 2015-10-22 NOTE — Patient Instructions (Signed)
Hypertension Hypertension, commonly called high blood pressure, is when the force of blood pumping through your arteries is too strong. Your arteries are the blood vessels that carry blood from your heart throughout your body. A blood pressure reading consists of a higher number over a lower number, such as 110/72. The higher number (systolic) is the pressure inside your arteries when your heart pumps. The lower number (diastolic) is the pressure inside your arteries when your heart relaxes. Ideally you want your blood pressure below 120/80. Hypertension forces your heart to work harder to pump blood. Your arteries may become narrow or stiff. Having untreated or uncontrolled hypertension can cause heart attack, stroke, kidney disease, and other problems. RISK FACTORS Some risk factors for high blood pressure are controllable. Others are not.  Risk factors you cannot control include:   Race. You may be at higher risk if you are African American.  Age. Risk increases with age.  Gender. Men are at higher risk than women before age 45 years. After age 65, women are at higher risk than men. Risk factors you can control include:  Not getting enough exercise or physical activity.  Being overweight.  Getting too much fat, sugar, calories, or salt in your diet.  Drinking too much alcohol. SIGNS AND SYMPTOMS Hypertension does not usually cause signs or symptoms. Extremely high blood pressure (hypertensive crisis) may cause headache, anxiety, shortness of breath, and nosebleed. DIAGNOSIS To check if you have hypertension, your health care provider will measure your blood pressure while you are seated, with your arm held at the level of your heart. It should be measured at least twice using the same arm. Certain conditions can cause a difference in blood pressure between your right and left arms. A blood pressure reading that is higher than normal on one occasion does not mean that you need treatment. If  it is not clear whether you have high blood pressure, you may be asked to return on a different day to have your blood pressure checked again. Or, you may be asked to monitor your blood pressure at home for 1 or more weeks. TREATMENT Treating high blood pressure includes making lifestyle changes and possibly taking medicine. Living a healthy lifestyle can help lower high blood pressure. You may need to change some of your habits. Lifestyle changes may include:  Following the DASH diet. This diet is high in fruits, vegetables, and whole grains. It is low in salt, red meat, and added sugars.  Keep your sodium intake below 2,300 mg per day.  Getting at least 30-45 minutes of aerobic exercise at least 4 times per week.  Losing weight if necessary.  Not smoking.  Limiting alcoholic beverages.  Learning ways to reduce stress. Your health care provider may prescribe medicine if lifestyle changes are not enough to get your blood pressure under control, and if one of the following is true:  You are 18-59 years of age and your systolic blood pressure is above 140.  You are 60 years of age or older, and your systolic blood pressure is above 150.  Your diastolic blood pressure is above 90.  You have diabetes, and your systolic blood pressure is over 140 or your diastolic blood pressure is over 90.  You have kidney disease and your blood pressure is above 140/90.  You have heart disease and your blood pressure is above 140/90. Your personal target blood pressure may vary depending on your medical conditions, your age, and other factors. HOME CARE INSTRUCTIONS    Have your blood pressure rechecked as directed by your health care provider.   Take medicines only as directed by your health care provider. Follow the directions carefully. Blood pressure medicines must be taken as prescribed. The medicine does not work as well when you skip doses. Skipping doses also puts you at risk for  problems.  Do not smoke.   Monitor your blood pressure at home as directed by your health care provider. SEEK MEDICAL CARE IF:   You think you are having a reaction to medicines taken.  You have recurrent headaches or feel dizzy.  You have swelling in your ankles.  You have trouble with your vision. SEEK IMMEDIATE MEDICAL CARE IF:  You develop a severe headache or confusion.  You have unusual weakness, numbness, or feel faint.  You have severe chest or abdominal pain.  You vomit repeatedly.  You have trouble breathing. MAKE SURE YOU:   Understand these instructions.  Will watch your condition.  Will get help right away if you are not doing well or get worse.   This information is not intended to replace advice given to you by your health care provider. Make sure you discuss any questions you have with your health care provider.   Document Released: 09/07/2005 Document Revised: 01/22/2015 Document Reviewed: 06/30/2013 Elsevier Interactive Patient Education 2016 [employer] Inc[employer]  

## 2015-10-22 NOTE — Progress Notes (Signed)
Pre visit review using our clinic review tool, if applicable. No additional management support is needed unless otherwise documented below in the visit note. 

## 2015-10-22 NOTE — Progress Notes (Addendum)
Subjective:  Patient ID: Megan Rose, female    DOB: Dec 01, 1950  Age: 65 y.o. MRN: [id number]  CC: Hypertension and Hyperlipidemia   HPI Darrell Jelley presents for establishment as a new patient as well as follow-up on hypertension and hypercholesterolemia. She needs a blood pressure check today. She has had hypertension for many years with no complications. She has recently been well controlled with a combination of an ARB/hydrochlorothiazide. She tells me that at home her blood pressures are well controlled and she has no signs and symptoms related to hypertension. She also denies any side effects from these medications.  History Kemberly has a past medical history of PONV (postoperative nausea and vomiting); Pneumonia; Anemia (Dx 2015); Arthritis (Dx 2015); Hypertension (Dx 1981); and Cancer ([place]) (1991).   She has past surgical history that includes Mastectomy, radical (Bilateral); Abdominal hysterectomy; Tubal ligation; Knee surgery (Right, 1972); Cosmetic surgery (Left); Ankle arthroscopy (Right, 05/25/2014); Breast surgery; Simple mastectomy; and Mastectomy, radical.   Her family history includes Anuerysm in her father; Cancer in her father; Heart disease in her mother; Hypertension in her daughter.She reports that she has quit smoking. Her smoking use included Cigarettes. She has a 1.5 pack-year smoking history. She quit smokeless tobacco use about 36 years ago. She reports that she drinks alcohol. She reports that she does not use illicit drugs.  Outpatient Prescriptions Prior to Visit  Medication Sig Dispense Refill  . albuterol (PROVENTIL HFA;VENTOLIN HFA) 108 (90 BASE) MCG/ACT inhaler Inhale 2 puffs into the lungs every 6 (six) hours as needed for wheezing or shortness of breath. 1 Inhaler 2  . aspirin 81 MG tablet Take 81 mg by mouth daily.    . Menthol-Methyl Salicylate (MUSCLE RUB) 10-15 % CREA Apply 1 application topically as needed for muscle pain. Reported on  09/10/2015    . naproxen sodium (ANAPROX) 220 MG tablet Take 220 mg by mouth 2 (two) times daily as needed.     . valsartan-hydrochlorothiazide (DIOVAN HCT) 160-25 MG tablet Take 1 tablet by mouth daily. 30 tablet 11  . acetaminophen-codeine (TYLENOL #3) 300-30 MG per tablet Take 1 tablet by mouth every 8 (eight) hours as needed for moderate pain. (Patient taking differently: Take 0.25 tablets by mouth every 8 (eight) hours as needed for moderate pain. ) 60 tablet 0  . aspirin EC 325 MG tablet Take 325 mg by mouth every 4 (four) hours as needed. Reported on 09/10/2015    . loratadine (CLARITIN) 10 MG tablet Take 10 mg by mouth 2 (two) times daily.     . Misc Natural Products (LUNG TONIC) LIQD Take 20 drops by mouth every other day.     No facility-administered medications prior to visit.    ROS Review of Systems  Constitutional: Negative.  Negative for fever, chills, diaphoresis, appetite change and fatigue.  HENT: Negative.   Eyes: Negative.   Respiratory: Negative.  Negative for cough, choking, chest tightness, shortness of breath and stridor.   Cardiovascular: Negative.  Negative for chest pain, palpitations and leg swelling.  Gastrointestinal: Negative.  Negative for nausea, vomiting, abdominal pain, diarrhea and constipation.  Endocrine: Negative.   Genitourinary: Negative.   Musculoskeletal: Positive for arthralgias. Negative for myalgias, back pain, joint swelling and neck pain.  Skin: Negative.   Allergic/Immunologic: Negative.   Neurological: Negative.  Negative for dizziness, tremors, weakness, light-headedness, numbness and headaches.  Hematological: Negative.  Negative for adenopathy. Does not bruise/bleed easily.  Psychiatric/Behavioral: Negative.     Objective:  BP 130/80 mmHg  Pulse 87  Temp(Src) 97.9 F (36.6 C) (Oral)  Resp 16  Ht 5' (1.524 m)  Wt 229 lb (103.874 kg)  BMI 44.72 kg/m2  SpO2 95%  Physical Exam  Constitutional: She is oriented to person, place,  and time. No distress.  HENT:  Mouth/Throat: Oropharynx is clear and moist. No oropharyngeal exudate.  Eyes: Conjunctivae are normal. Right eye exhibits no discharge. Left eye exhibits no discharge. No scleral icterus.  Neck: Normal range of motion. Neck supple. No JVD present. No tracheal deviation present. No thyromegaly present.  Cardiovascular: Normal rate, regular rhythm, normal heart sounds and intact distal pulses.  Exam reveals no gallop and no friction rub.   No murmur heard. Pulmonary/Chest: Effort normal and breath sounds normal. No stridor. No respiratory distress. She has no wheezes. She has no rales. She exhibits no tenderness.  Abdominal: Soft. Bowel sounds are normal. She exhibits no distension and no mass. There is no tenderness. There is no rebound and no guarding.  Musculoskeletal: Normal range of motion. She exhibits no edema or tenderness.  Lymphadenopathy:    She has no cervical adenopathy.  Neurological: She is oriented to person, place, and time.  Skin: Skin is warm and dry. No rash noted. She is not diaphoretic. No erythema. No pallor.  Vitals reviewed.  Lab Results  Component Value Date   WBC 8.6 10/22/2015   HGB 15.8* 10/22/2015   HCT 46.3* 10/22/2015   PLT 311.0 10/22/2015   GLUCOSE 123* 10/22/2015   CHOL 222* 10/22/2015   TRIG 227.0* 10/22/2015   HDL 48.00 10/22/2015   LDLDIRECT 140.0 10/22/2015   ALT 16 10/22/2015   AST 17 10/22/2015   NA 140 10/22/2015   K 3.9 10/22/2015   CL 102 10/22/2015   CREATININE 0.99 10/22/2015   BUN 19 10/22/2015   CO2 30 10/22/2015   TSH 2.67 10/22/2015   HGBA1C 6.2 10/22/2015    Assessment & Plan:   Lempi was seen today for hypertension and hyperlipidemia.  Diagnoses and all orders for this visit:  Essential hypertension- her blood pressure is well-controlled, lites and renal function are stable. -     Comprehensive metabolic panel; Future -     CBC with Differential/Platelet; Future  Hyperglycemia- she  has prediabetes, she will continue to work on her lifestyle modifications. -     Hemoglobin A1c; Future  Need for hepatitis C screening test -     Hepatitis C antibody; Future  Hyperlipidemia with target LDL less than 130- her Framingham risk score is is 8% which is right at the borderline, I presented her with the opportunity to start a statin and am waiting for her response. -     Lipid panel; Future -     TSH; Future  Other orders -     omeprazole (PRILOSEC) 40 MG capsule; Take 1 capsule (40 mg total) by mouth daily.   I have discontinued Ms. Romberg's aspirin EC, loratadine, acetaminophen-codeine, and LUNG TONIC. I have also changed her omeprazole. Additionally, I am having her maintain her MUSCLE RUB, albuterol, naproxen sodium, aspirin, valsartan-hydrochlorothiazide, famotidine, and OVER THE COUNTER MEDICATION.  Meds ordered this encounter  Medications  . famotidine (PEPCID) 40 MG tablet    Sig: Take 40 mg by mouth daily.  Marland Kitchen DISCONTD: omeprazole (PRILOSEC) 40 MG capsule    Sig: Take 40 mg by mouth daily.  Marland Kitchen OVER THE COUNTER MEDICATION    Sig: 20 mg. CORTAB  . omeprazole (PRILOSEC) 40 MG capsule    Sig:  Take 1 capsule (40 mg total) by mouth daily.    Dispense:  90 capsule    Refill:  3     Follow-up: Return in about 6 months (around 04/20/2016).  Scarlette Calico, MD

## 2015-10-23 ENCOUNTER — Encounter: Payer: Self-pay | Admitting: Internal Medicine

## 2015-10-23 LAB — HEPATITIS C ANTIBODY: HCV AB: NEGATIVE

## 2015-11-08 ENCOUNTER — Telehealth: Payer: Self-pay | Admitting: Internal Medicine

## 2015-11-08 NOTE — Telephone Encounter (Signed)
Spoke with pt to relay below recs.  Nothing further needed.

## 2015-11-08 NOTE — Telephone Encounter (Signed)
Spoke with pt, states that she is so impressed with MW as well as her friend who encouraged her to see him- pt wants to know if MW can recommend an internist in the [facility] or [facility] area with a similar bedside manner, knowledge, and approach for friend to establish with.  Pt acknowledges this is a long stretch, but states she is "desperate" to find someone like MW in Internal Medicine.  MW please advise if you are familiar with anyone in the area.  Thanks!

## 2015-11-08 NOTE — Telephone Encounter (Signed)
Sorry I don't but the main criteria is ABIM  Designer, industrial/product of Internal Medicine) in terms of qualifications  Good luck!

## 2015-12-11 ENCOUNTER — Ambulatory Visit (INDEPENDENT_AMBULATORY_CARE_PROVIDER_SITE_OTHER): Payer: Medicare Other | Admitting: Internal Medicine

## 2015-12-11 ENCOUNTER — Encounter: Payer: Self-pay | Admitting: Internal Medicine

## 2015-12-11 ENCOUNTER — Ambulatory Visit (INDEPENDENT_AMBULATORY_CARE_PROVIDER_SITE_OTHER)
Admission: RE | Admit: 2015-12-11 | Discharge: 2015-12-11 | Disposition: A | Discharge status: Home / Self Care | Payer: Medicare Other | Source: Ambulatory Visit | Attending: Internal Medicine | Admitting: Internal Medicine

## 2015-12-11 VITALS — BP 126/80 | HR 98 | Temp 98.5°F | Resp 16 | Ht 60.0 in | Wt 232.0 lb

## 2015-12-11 DIAGNOSIS — L308 Other specified dermatitis: Secondary | ICD-10-CM

## 2015-12-11 DIAGNOSIS — G5622 Lesion of ulnar nerve, left upper limb: Secondary | ICD-10-CM | POA: Diagnosis not present

## 2015-12-11 DIAGNOSIS — M19022 Primary osteoarthritis, left elbow: Secondary | ICD-10-CM | POA: Diagnosis not present

## 2015-12-11 DIAGNOSIS — M159 Polyosteoarthritis, unspecified: Secondary | ICD-10-CM | POA: Insufficient documentation

## 2015-12-11 DIAGNOSIS — M25522 Pain in left elbow: Secondary | ICD-10-CM | POA: Diagnosis not present

## 2015-12-11 MED ORDER — CHLORPHENIRAMINE MALEATE 4 MG PO TABS: 4.0000 mg | ORAL_TABLET | ORAL | Status: AC | PRN

## 2015-12-11 MED ORDER — FLUOCINONIDE-E 0.05 % EX CREA: 1.0000 "application " | TOPICAL_CREAM | Freq: Two times a day (BID) | CUTANEOUS | Status: DC

## 2015-12-11 NOTE — Patient Instructions (Signed)
Eczema Eczema, also called atopic dermatitis, is a skin disorder that causes inflammation of the skin. It causes a red rash and dry, scaly skin. The skin becomes very itchy. Eczema is generally worse during the cooler winter months and often improves with the warmth of summer. Eczema usually starts showing signs in infancy. Some children outgrow eczema, but it may last through adulthood.  CAUSES  The exact cause of eczema is not known, but it appears to run in families. People with eczema often have a family history of eczema, allergies, asthma, or hay fever. Eczema is not contagious. Flare-ups of the condition may be caused by:   Contact with something you are sensitive or allergic to.   Stress. SIGNS AND SYMPTOMS  Dry, scaly skin.   Red, itchy rash.   Itchiness. This may occur before the skin rash and may be very intense.  DIAGNOSIS  The diagnosis of eczema is usually made based on symptoms and medical history. TREATMENT  Eczema cannot be cured, but symptoms usually can be controlled with treatment and other strategies. A treatment plan might include:  Controlling the itching and scratching.   Use over-the-counter antihistamines as directed for itching. This is especially useful at night when the itching tends to be worse.   Use over-the-counter steroid creams as directed for itching.   Avoid scratching. Scratching makes the rash and itching worse. It may also result in a skin infection (impetigo) due to a break in the skin caused by scratching.   Keeping the skin well moisturized with creams every day. This will seal in moisture and help prevent dryness. Lotions that contain alcohol and water should be avoided because they can dry the skin.   Limiting exposure to things that you are sensitive or allergic to (allergens).   Recognizing situations that cause stress.   Developing a plan to manage stress.  HOME CARE INSTRUCTIONS   Only take over-the-counter or  prescription medicines as directed by your health care provider.   Do not use anything on the skin without checking with your health care provider.   Keep baths or showers short (5 minutes) in warm (not hot) water. Use mild cleansers for bathing. These should be unscented. You may add nonperfumed bath oil to the bath water. It is best to avoid soap and bubble bath.   Immediately after a bath or shower, when the skin is still damp, apply a moisturizing ointment to the entire body. This ointment should be a petroleum ointment. This will seal in moisture and help prevent dryness. The thicker the ointment, the better. These should be unscented.   Keep fingernails cut short. Children with eczema may need to wear soft gloves or mittens at night after applying an ointment.   Dress in clothes made of cotton or cotton blends. Dress lightly, because heat increases itching.   A child with eczema should stay away from anyone with fever blisters or cold sores. The virus that causes fever blisters (herpes simplex) can cause a serious skin infection in children with eczema. SEEK MEDICAL CARE IF:   Your itching interferes with sleep.   Your rash gets worse or is not better within 1 week after starting treatment.   You see pus or soft yellow scabs in the rash area.   You have a fever.   You have a rash flare-up after contact with someone who has fever blisters.    This information is not intended to replace advice given to you by your health care   provider. Make sure you discuss any questions you have with your health care provider.   Document Released: 09/04/2000 Document Revised: 06/28/2013 Document Reviewed: 04/10/2013 Elsevier Interactive Patient Education 2016 [employer]  

## 2015-12-11 NOTE — Progress Notes (Signed)
Pre visit review using our clinic review tool, if applicable. No additional management support is needed unless otherwise documented below in the visit note. 

## 2015-12-15 NOTE — Progress Notes (Signed)
Subjective:  Patient ID: Megan Rose, female    DOB: [birth year]  Age: 65 y.o. MRN: [id number]  CC: Rash and Elbow Pain   HPI Megan Rose presents for he complaints of an itchy rash on her torso and left elbow pain for about 2 months.  She has been taking chlorpheniramine for the itching but that has not helped much. She complains of rash and excoriations on her upper torso and upper arms. This is been going on for several months.  She also complains of pain in her left elbow and numbness in her left hand over the fourth and fifth fingers. She denies any specific trauma or injury. She takes naproxen for the pain he gets some relief.  Outpatient Prescriptions Prior to Visit  Medication Sig Dispense Refill  . albuterol (PROVENTIL HFA;VENTOLIN HFA) 108 (90 BASE) MCG/ACT inhaler Inhale 2 puffs into the lungs every 6 (six) hours as needed for wheezing or shortness of breath. 1 Inhaler 2  . aspirin 81 MG tablet Take 81 mg by mouth daily.    . famotidine (PEPCID) 40 MG tablet Take 40 mg by mouth daily.    . naproxen sodium (ANAPROX) 220 MG tablet Take 220 mg by mouth 2 (two) times daily as needed.     Marland Kitchen omeprazole (PRILOSEC) 40 MG capsule Take 1 capsule (40 mg total) by mouth daily. 90 capsule 3  . OVER THE COUNTER MEDICATION 20 mg. CORTAB    . valsartan-hydrochlorothiazide (DIOVAN HCT) 160-25 MG tablet Take 1 tablet by mouth daily. 30 tablet 11  . Menthol-Methyl Salicylate (MUSCLE RUB) 10-15 % CREA Apply 1 application topically as needed for muscle pain. Reported on 09/10/2015     No facility-administered medications prior to visit.    ROS Review of Systems  Constitutional: Negative.  Negative for fever, chills, diaphoresis, appetite change and fatigue.  HENT: Negative.   Eyes: Negative.   Respiratory: Negative.  Negative for cough, choking, chest tightness, shortness of breath and stridor.   Cardiovascular: Negative.  Negative for chest pain, palpitations and leg swelling.   Gastrointestinal: Negative.  Negative for nausea, vomiting, abdominal pain, diarrhea, constipation and blood in stool.  Endocrine: Negative.   Genitourinary: Negative.  Negative for dysuria, hematuria and difficulty urinating.  Musculoskeletal: Positive for arthralgias. Negative for myalgias, back pain and neck pain.  Skin: Positive for rash. Negative for color change, pallor and wound.  Allergic/Immunologic: Negative.   Neurological: Positive for numbness. Negative for dizziness and weakness.  Hematological: Negative.  Negative for adenopathy. Does not bruise/bleed easily.  Psychiatric/Behavioral: Negative.     Objective:  BP 126/80 mmHg  Pulse 98  Temp(Src) 98.5 F (36.9 C) (Oral)  Resp 16  Ht 5' (1.524 m)  Wt 232 lb (105.235 kg)  BMI 45.31 kg/m2  SpO2 98%  BP Readings from Last 3 Encounters:  12/11/15 126/80  10/22/15 130/80  10/03/15 124/80    Wt Readings from Last 3 Encounters:  12/11/15 232 lb (105.235 kg)  10/22/15 229 lb (103.874 kg)  10/03/15 238 lb (107.956 kg)    Physical Exam  Constitutional: She is oriented to person, place, and time. She appears well-developed and well-nourished. No distress.  HENT:  Mouth/Throat: Oropharynx is clear and moist. No oropharyngeal exudate.  Eyes: Conjunctivae are normal. Right eye exhibits no discharge. Left eye exhibits no discharge. No scleral icterus.  Neck: Normal range of motion. Neck supple. No JVD present. No tracheal deviation present. No thyromegaly present.  Cardiovascular: Normal rate, regular rhythm, normal heart sounds and  intact distal pulses.  Exam reveals no gallop and no friction rub.   No murmur heard. Pulmonary/Chest: Effort normal and breath sounds normal. No stridor. No respiratory distress. She has no wheezes. She has no rales. She exhibits no tenderness.  Abdominal: Soft. Bowel sounds are normal. She exhibits no distension and no mass. There is no tenderness. There is no rebound and no guarding.    Musculoskeletal: She exhibits no edema.       Left elbow: She exhibits normal range of motion, no swelling, no effusion, no deformity and no laceration. Tenderness found. Medial epicondyle tenderness noted. No radial head, no lateral epicondyle and no olecranon process tenderness noted.  Lymphadenopathy:    She has no cervical adenopathy.  Neurological: She is oriented to person, place, and time.  Skin: Skin is warm and dry. Rash noted. No abrasion, no bruising, no petechiae and no purpura noted. Rash is papular. Rash is not macular, not nodular, not pustular, not vesicular and not urticarial. She is not diaphoretic. No erythema. No pallor.  Over her anterior torso and upper extremities there are small excoriated areas of papular eczema  Psychiatric: She has a normal mood and affect. Her behavior is normal. Judgment and thought content normal.  Vitals reviewed.   Lab Results  Component Value Date   WBC 8.6 10/22/2015   HGB 15.8* 10/22/2015   HCT 46.3* 10/22/2015   PLT 311.0 10/22/2015   GLUCOSE 123* 10/22/2015   CHOL 222* 10/22/2015   TRIG 227.0* 10/22/2015   HDL 48.00 10/22/2015   LDLDIRECT 140.0 10/22/2015   ALT 16 10/22/2015   AST 17 10/22/2015   NA 140 10/22/2015   K 3.9 10/22/2015   CL 102 10/22/2015   CREATININE 0.99 10/22/2015   BUN 19 10/22/2015   CO2 30 10/22/2015   TSH 2.67 10/22/2015   HGBA1C 6.2 10/22/2015    Dg Elbow Complete Left  12/11/2015  CLINICAL DATA:  One month history of posterior elbow pain without known injury EXAM: LEFT ELBOW - COMPLETE 3+ VIEW COMPARISON:  None in PACs FINDINGS: The bones are adequately mineralized. There is minimal spurring along the medial aspect of the coronoid process of the olecranon. There is no significant posterior olecranon spurring. No significant swelling of the olecranon bursa is observed. There is no joint effusion. The radial head and distal humerus are intact. IMPRESSION: There is mild degenerative change centered on the  coronoid process of the olecranon. No posterior bony abnormalities are observed. Electronically Signed   By: Megan  Rose M.D.   On: 12/11/2015 16:01    Assessment & Plan:   Megan Rose was seen today for rash and elbow pain.  Diagnoses and all orders for this visit:  Asteatotic eczema- she will continue taking the oral antihistamine but I have also asked her to start a topical/potent steroid -     fluocinonide-emollient (LIDEX-E) 0.05 % cream; Apply 1 application topically 2 (two) times daily. -     chlorpheniramine (CHLOR-TRIMETON) 4 MG tablet; Take 1 tablet (4 mg total) by mouth every 4 (four) hours as needed for allergies.  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 Saadeh's MUSCLE RUB. I am also having her start on fluocinonide-emollient and chlorpheniramine. Additionally, I am having her maintain her albuterol, naproxen sodium, aspirin, valsartan-hydrochlorothiazide, famotidine, OVER THE COUNTER MEDICATION, and omeprazole.  Meds ordered this encounter  Medications  . fluocinonide-emollient (LIDEX-E) 0.05 % cream    Sig: Apply 1 application topically 2 (two) times daily.    Dispense:  120 g    Refill:  2  . chlorpheniramine (CHLOR-TRIMETON) 4 MG tablet    Sig: Take 1 tablet (4 mg total) by mouth every 4 (four) hours as needed for allergies.    Dispense:  120 tablet    Refill:  11     Follow-up: Return if symptoms worsen or fail to improve.  Scarlette Calico, MD

## 2015-12-16 ENCOUNTER — Other Ambulatory Visit: Payer: Self-pay | Admitting: Internal Medicine

## 2015-12-16 ENCOUNTER — Telehealth: Payer: Self-pay | Admitting: Internal Medicine

## 2015-12-16 DIAGNOSIS — G5622 Lesion of ulnar nerve, left upper limb: Secondary | ICD-10-CM

## 2015-12-16 DIAGNOSIS — M25522 Pain in left elbow: Secondary | ICD-10-CM

## 2015-12-16 NOTE — Telephone Encounter (Signed)
Patient states she is returning your call

## 2015-12-16 NOTE — Telephone Encounter (Signed)
Patient called back. Advised of the below message and the imaging results. She does have one question regarding plan of care.   She is asking if there is anything that she can do in the meantime to help to keep her from dropping things. She states that she will be available for the rest of the day. And that it is ok to leave vm with detail.

## 2015-12-16 NOTE — Telephone Encounter (Signed)
LVM for pt to call back as soon as possible.   RE: results of recent xray (in the imaging tab). Ortho referral has been placed.

## 2015-12-17 NOTE — Telephone Encounter (Signed)
The below phone note was addressed via the imaging tab with PCP recommendation.

## 2015-12-24 ENCOUNTER — Ambulatory Visit: Payer: Medicare Other | Admitting: Occupational Therapy

## 2015-12-25 ENCOUNTER — Emergency Department (HOSPITAL_COMMUNITY)
Admission: EM | Admit: 2015-12-25 | Discharge: 2015-12-25 | Disposition: A | Discharge status: Home / Self Care | Payer: Medicare Other | Attending: Emergency Medicine | Admitting: Emergency Medicine

## 2015-12-25 ENCOUNTER — Encounter (HOSPITAL_COMMUNITY): Payer: Self-pay

## 2015-12-25 DIAGNOSIS — Z87891 Personal history of nicotine dependence: Secondary | ICD-10-CM | POA: Insufficient documentation

## 2015-12-25 DIAGNOSIS — R231 Pallor: Secondary | ICD-10-CM | POA: Insufficient documentation

## 2015-12-25 DIAGNOSIS — F419 Anxiety disorder, unspecified: Secondary | ICD-10-CM | POA: Diagnosis not present

## 2015-12-25 DIAGNOSIS — T380X5A Adverse effect of glucocorticoids and synthetic analogues, initial encounter: Secondary | ICD-10-CM | POA: Diagnosis not present

## 2015-12-25 DIAGNOSIS — Z862 Personal history of diseases of the blood and blood-forming organs and certain disorders involving the immune mechanism: Secondary | ICD-10-CM | POA: Insufficient documentation

## 2015-12-25 DIAGNOSIS — R531 Weakness: Secondary | ICD-10-CM | POA: Diagnosis not present

## 2015-12-25 DIAGNOSIS — R110 Nausea: Secondary | ICD-10-CM | POA: Diagnosis not present

## 2015-12-25 DIAGNOSIS — R064 Hyperventilation: Secondary | ICD-10-CM

## 2015-12-25 DIAGNOSIS — Z79899 Other long term (current) drug therapy: Secondary | ICD-10-CM | POA: Insufficient documentation

## 2015-12-25 DIAGNOSIS — Z880 Allergy status to penicillin: Secondary | ICD-10-CM | POA: Diagnosis not present

## 2015-12-25 DIAGNOSIS — M1990 Unspecified osteoarthritis, unspecified site: Secondary | ICD-10-CM | POA: Insufficient documentation

## 2015-12-25 DIAGNOSIS — Z8701 Personal history of pneumonia (recurrent): Secondary | ICD-10-CM | POA: Insufficient documentation

## 2015-12-25 DIAGNOSIS — I10 Essential (primary) hypertension: Secondary | ICD-10-CM | POA: Insufficient documentation

## 2015-12-25 DIAGNOSIS — R404 Transient alteration of awareness: Secondary | ICD-10-CM | POA: Diagnosis not present

## 2015-12-25 DIAGNOSIS — R55 Syncope and collapse: Secondary | ICD-10-CM

## 2015-12-25 DIAGNOSIS — Z853 Personal history of malignant neoplasm of breast: Secondary | ICD-10-CM | POA: Diagnosis not present

## 2015-12-25 DIAGNOSIS — M7702 Medial epicondylitis, left elbow: Secondary | ICD-10-CM | POA: Diagnosis not present

## 2015-12-25 DIAGNOSIS — Z7982 Long term (current) use of aspirin: Secondary | ICD-10-CM | POA: Diagnosis not present

## 2015-12-25 DIAGNOSIS — T50905A Adverse effect of unspecified drugs, medicaments and biological substances, initial encounter: Secondary | ICD-10-CM

## 2015-12-25 DIAGNOSIS — R42 Dizziness and giddiness: Secondary | ICD-10-CM | POA: Diagnosis not present

## 2015-12-25 LAB — TROPONIN I

## 2015-12-25 LAB — CBC WITH DIFFERENTIAL/PLATELET
BASOS ABS: 0 10*3/uL (ref 0.0–0.1)
BASOS PCT: 0 %
EOS PCT: 0 %
Eosinophils Absolute: 0 10*3/uL (ref 0.0–0.7)
HCT: 42.9 % (ref 36.0–46.0)
HEMOGLOBIN: 15 g/dL (ref 12.0–15.0)
LYMPHS PCT: 12 %
Lymphs Abs: 1.2 10*3/uL (ref 0.7–4.0)
MCH: 33.6 pg (ref 26.0–34.0)
MCHC: 35 g/dL (ref 30.0–36.0)
MCV: 96 fL (ref 78.0–100.0)
Monocytes Absolute: 0.5 10*3/uL (ref 0.1–1.0)
Monocytes Relative: 5 %
NEUTROS ABS: 8.2 10*3/uL — AB (ref 1.7–7.7)
Neutrophils Relative %: 83 %
Platelets: 264 10*3/uL (ref 150–400)
RBC: 4.47 MIL/uL (ref 3.87–5.11)
RDW: 12.4 % (ref 11.5–15.5)
WBC: 9.9 10*3/uL (ref 4.0–10.5)

## 2015-12-25 LAB — BASIC METABOLIC PANEL
ANION GAP: 10 (ref 5–15)
BUN: 19 mg/dL (ref 6–20)
CHLORIDE: 104 mmol/L (ref 101–111)
CO2: 24 mmol/L (ref 22–32)
Calcium: 9.3 mg/dL (ref 8.9–10.3)
Creatinine, Ser: 1 mg/dL (ref 0.44–1.00)
GFR calc non Af Amer: 58 mL/min — ABNORMAL LOW (ref >60)
Glucose, Bld: 160 mg/dL — ABNORMAL HIGH (ref 65–99)
POTASSIUM: 3.5 mmol/L (ref 3.5–5.1)
SODIUM: 138 mmol/L (ref 135–145)

## 2015-12-25 MED ORDER — NAPROXEN 500 MG PO TABS
500.0000 mg | ORAL_TABLET | Freq: Once | ORAL | Status: AC
  Administered 2015-12-25: 500 mg via ORAL
  Filled 2015-12-25: qty 1

## 2015-12-25 NOTE — ED Notes (Signed)
Per EMS- Patient was at the orthopedic office today and had a steroid injection to her left elbow. Patient had a syncopal episode following the steroid injection. Patient continues to c/ headache, nausea, and dizziness. patient was given Zofran 4 mg IV

## 2015-12-25 NOTE — Discharge Instructions (Signed)
Syncope, commonly known as fainting, is a temporary loss of consciousness. It occurs when the blood flow to the brain is reduced. Vasovagal syncope (also called neurocardiogenic syncope) is a fainting spell in which the blood flow to the brain is reduced because of a sudden drop in heart rate and blood pressure. Vasovagal syncope occurs when the brain and the cardiovascular system (blood vessels) do not adequately communicate and respond to each other. This is the most common cause of fainting. It often occurs in response to fear or some other type of emotional or physical stress. The body has a reaction in which the heart starts beating too slowly or the blood vessels expand, reducing blood pressure. This type of fainting spell is generally considered harmless. However, injuries can occur if a person takes a sudden fall during a fainting spell.  CAUSES  Vasovagal syncope occurs when a person's blood pressure and heart rate decrease suddenly, usually in response to a trigger. Many things and situations can trigger an episode. Some of these include:   Pain.   Fear.   The sight of blood or medical procedures, such as blood being drawn from a vein.   Common activities, such as coughing, swallowing, stretching, or going to the bathroom.   Emotional stress.   Prolonged standing, especially in a warm environment.   Lack of sleep or rest.   Prolonged lack of food.   Prolonged lack of fluids.   Recent illness.  The use of certain drugs that affect blood pressure, such as cocaine, alcohol, marijuana, inhalants, and opiates.  SYMPTOMS  Before the fainting episode, you may:   Feel dizzy or light headed.   Become pale.  Sense that you are going to faint.   Feel like the room is spinning.   Have tunnel vision, only seeing directly in front of you.   Feel sick to your stomach (nauseous).   See spots or slowly lose vision.   Hear ringing in your ears.   Have a headache.    Feel warm and sweaty.   Feel a sensation of pins and needles. During the fainting spell, you will generally be unconscious for no longer than a couple minutes before waking up and returning to normal. If you get up too quickly before your body can recover, you may faint again. Some twitching or jerky movements may occur during the fainting spell.  DIAGNOSIS  Your health care provider will ask about your symptoms, take a medical history, and perform a physical exam. Various tests may be done to rule out other causes of fainting. These may include blood tests and tests to check the heart, such as electrocardiography, echocardiography, and possibly an electrophysiology study. When other causes have been ruled out, a test may be done to check the body's response to changes in position (tilt table test). TREATMENT  Most cases of vasovagal syncope do not require treatment. Your health care provider may recommend ways to avoid fainting triggers and may provide home strategies for preventing fainting. If you must be exposed to a possible trigger, you can drink additional fluids to help reduce your chances of having an episode of vasovagal syncope. If you have warning signs of an oncoming episode, you can respond by positioning yourself favorably (lying down). If your fainting spells continue, you may be given medicines to prevent fainting. Some medicines may help make you more resistant to repeated episodes of vasovagal syncope. Special exercises or compression stockings may be recommended. In rare cases, the surgical  placement of a pacemaker is considered. HOME CARE INSTRUCTIONS   Learn to identify the warning signs of vasovagal syncope.   Sit or lie down at the first warning sign of a fainting spell. If sitting, put your head down between your legs. If you lie down, swing your legs up in the air to increase blood flow to the brain.   Avoid hot tubs and saunas.  Avoid prolonged standing.  Drink  enough fluids to keep your urine clear or pale yellow. Avoid caffeine.  Increase salt in your diet as directed by your health care provider.   If you have to stand for a long time, perform movements such as:   Crossing your legs.   Flexing and stretching your leg muscles.   Squatting.   Moving your legs.   Bending over.   Only take over-the-counter or prescription medicines as directed by your health care provider. Do not suddenly stop any medicines without asking your health care provider first. [place] IF:   Your fainting spells continue or happen more frequently in spite of treatment.   You lose consciousness for more than a couple minutes.  You have fainting spells during or after exercising or after being startled.   You have new symptoms that occur with the fainting spells, such as:   Shortness of breath.  Chest pain.   Irregular heartbeat.   You have episodes of twitching or jerky movements that last longer than a few seconds.  You have episodes of twitching or jerky movements without obvious fainting. SEEK IMMEDIATE MEDICAL CARE IF:   You have injuries or bleeding after a fainting spell.   You have episodes of twitching or jerky movements that last longer than 5 minutes.   You have more than one spell of twitching or jerky movements before returning to consciousness after fainting.   This information is not intended to replace advice given to you by your health care provider. Make sure you discuss any questions you have with your health care provider.   Document Released: 08/24/2012 Document Revised: 01/22/2015 Document Reviewed: 08/24/2012 Elsevier Interactive Patient Education 2016 Elsevier Inc[employer]  Hyperventilation Hyperventilation is breathing that is deeper and more rapid than normal. It is usually associated with panic and anxiety. Hyperventilation can make you feel breathless. It is sometimes called overbreathing. Breathing out  too much causes a decrease in the amount of carbon dioxide gas in the blood. This leads to tingling and numbness in the hands, feet, and around the mouth. If this continues, your fingers, hands, and toes may begin to spasm. Hyperventilation usually lasts 20-30 minutes and can be associated with other symptoms of panic and anxiety, including:   Chest pains or tightness.  A pounding or irregular, racing heartbeat (palpitations).  Dizziness.  Lightheadedness.  Dry mouth.  Weakness.  Confusion.  Sleep disturbance. CAUSES  Sudden onset (acute) hyperventilation is usually triggered by acute stress, anxiety, or emotional upset. Long-term (chronic) and recurring hyperventilation can occur with chronic lung problems, such emphysema or asthma. Other causes include:   Nervousness.  Stress.  Stimulant, drug, or alcohol use.  Lung disease.  Infections, such as pneumonia.  Heart problems.  Severe pain.  Waking from a bad dream.  Pregnancy.  Bleeding. HOME CARE INSTRUCTIONS  Learn and use breathing exercises that help you breathe from your diaphragm and abdomen.  Practice relaxation techniques to reduce stress, such as visualization, meditation, and muscle release.  During an attack, try breathing into a paper bag. This changes  the carbon dioxide level and slows down breathing. SEEK IMMEDIATE MEDICAL CARE IF:  Your hyperventilation continues or gets worse. MAKE SURE YOU:  Understand these instructions.  Will watch your condition.  Will get help right away if you are not doing well or get worse.   This information is not intended to replace advice given to you by your health care provider. Make sure you discuss any questions you have with your health care provider.   Document Released: 09/04/2000 Document Revised: 03/08/2012 Document Reviewed: 12/17/2011 Elsevier Interactive Patient Education [employer].

## 2015-12-25 NOTE — ED Provider Notes (Signed)
CSN: [id number]     Arrival date & time 12/25/15  1452 History   First MD Initiated Contact with Patient 12/25/15 1530     Chief Complaint  Patient presents with  . Loss of Consciousness     (Consider location/radiation/quality/duration/timing/severity/associated sxs/prior Treatment) HPI Patient seen at Orthopedics for elbow pain and hand numbness for three months. Reports she felt well when to office. Received a steroid injection in left, medial elbow. She walked to check out and became very dizzy and passed out. Upon awakening, she reports feels very weird;like she "is there, but at the same time isn't". Still feels weak, nauseated and has a H/A . She feels tingly in both hands, feels like her left leg keeps jumping involuntarily. Denies any similar event. Past Medical History  Diagnosis Date  . PONV (postoperative nausea and vomiting)   . Pneumonia   . Anemia Dx 2015  . Arthritis Dx 2015  . Hypertension Dx 1981  . Cancer ([place]) 1991    Breast   Past Surgical History  Procedure Laterality Date  . Mastectomy, radical Bilateral     1991 initial cancer, second round of cancer treated by Dr. Carolynn Sayers   . Abdominal hysterectomy    . Tubal ligation    . Knee surgery Right 1972  . Cosmetic surgery Left     left chest after mastectomy  . Ankle arthroscopy Right 05/25/2014    Procedure: Right Ankle and Subtalar Arthroscopy ;  Surgeon: Newt Minion, MD;  Location: [facility];  Service: Orthopedics;  Laterality: Right;  . Breast surgery    . Simple mastectomy      right  . Mastectomy, radical      left   Family History  Problem Relation Age of Onset  . Heart disease Mother   . Anuerysm Father   . Cancer Father   . Hypertension Daughter    Social History  Substance Use Topics  . Smoking status: Former Smoker -- 0.25 packs/day for 6 years    Types: Cigarettes  . Smokeless tobacco: Former Systems developer    Quit date: 09/22/1979  . Alcohol Use: 0.0 oz/week    0 Standard drinks or equivalent per  week     Comment: rarely   OB History    No data available     Review of Systems 10 Systems reviewed and are negative for acute change except as noted in the HPI.   Allergies  Bee venom; Lisinopril; Penicillins; and Codeine  Home Medications   Prior to Admission medications   Medication Sig Start Date End Date Taking? Authorizing Provider  albuterol (PROVENTIL HFA;VENTOLIN HFA) 108 (90 BASE) MCG/ACT inhaler Inhale 2 puffs into the lungs every 6 (six) hours as needed for wheezing or shortness of breath. 04/23/15  Yes Boykin Nearing, MD  aspirin 81 MG tablet Take 81 mg by mouth daily.   Yes Historical Provider, MD  chlorpheniramine (CHLOR-TRIMETON) 4 MG tablet Take 1 tablet (4 mg total) by mouth every 4 (four) hours as needed for allergies. 12/11/15  Yes Janith Lima, MD  famotidine (PEPCID) 40 MG tablet Take 80 mg by mouth at bedtime.    Yes Historical Provider, MD  fluocinonide-emollient (LIDEX-E) 0.05 % cream Apply 1 application topically 2 (two) times daily. 12/11/15  Yes Janith Lima, MD  naproxen sodium (ANAPROX) 220 MG tablet Take 220 mg by mouth 2 (two) times daily as needed (pain).    Yes Historical Provider, MD  omeprazole (PRILOSEC) 40 MG capsule Take 1  capsule (40 mg total) by mouth daily. 10/22/15  Yes Janith Lima, MD  valsartan-hydrochlorothiazide (DIOVAN HCT) 160-25 MG tablet Take 1 tablet by mouth daily. 10/03/15  Yes Tanda Rockers, MD   BP 130/67 mmHg  Pulse 81  Temp(Src) 98.4 F (36.9 C) (Oral)  Resp 13  Ht 5' (1.524 m)  Wt 228 lb (103.42 kg)  BMI 44.53 kg/m2  SpO2 95% Physical Exam  Constitutional: She is oriented to person, place, and time.  Patient anxious and mildly hyperventilating. Mild pale appearance.  HENT:  Head: Normocephalic and atraumatic.  Right Ear: External ear normal.  Left Ear: External ear normal.  Nose: Nose normal.  Mouth/Throat: Oropharynx is clear and moist.  Eyes: EOM are normal. Pupils are equal, round, and reactive to light.   Neck: Neck supple.  Cardiovascular: Normal rate, regular rhythm, normal heart sounds and intact distal pulses.   Pulmonary/Chest: Effort normal and breath sounds normal.  Mild hyperventilation  Abdominal: Soft. Bowel sounds are normal. She exhibits no distension. There is no tenderness. There is no rebound and no guarding.  Musculoskeletal: Normal range of motion. She exhibits no edema or tenderness.  Injection site left medial epicondyle no erythema, swelling or streaking.  Neurological: She is alert and oriented to person, place, and time. No cranial nerve deficit. She exhibits normal muscle tone. Coordination normal.  Skin: Skin is warm and dry. There is pallor.  Psychiatric:  anxious    ED Course  Procedures (including critical care time) Labs Review Labs Reviewed  BASIC METABOLIC PANEL - Abnormal; Notable for the following:    Glucose, Bld 160 (*)    GFR calc non Af Amer 58 (*)    All other components within normal limits  CBC WITH DIFFERENTIAL/PLATELET - Abnormal; Notable for the following:    Neutro Abs 8.2 (*)    All other components within normal limits  TROPONIN I    Imaging Review No results found. I have personally reviewed and evaluated these images and lab results as part of my medical decision-making.   EKG Interpretation   Date/Time:  Wednesday December 25 2015 15:48:00 EDT Ventricular Rate:  84 PR Interval:  204 QRS Duration: 93 QT Interval:  401 QTC Calculation: 474 R Axis:   -30 Text Interpretation:  Sinus rhythm Left axis deviation Anteroseptal  infarct, old No STEMI, POOR R WAVE PROGRESSION C/W OLD Confirmed by  Johnney Killian, MD, Jeannie Done [phone]) on 12/25/2015 3:52:50 PM      MDM   Final diagnoses:  Adverse effects of medication, initial encounter  Vasovagal syncope  Hyperventilation   Patient has been observed for 3-1/2 hours. Vital signs have remained stable. There has been no episode of hypotension or respiratory difficulty. She has not developed  rash or swelling. I suspect this is a vasovagal episode related to injection. I do not see signs of actual allergic reaction. On first examination, the patient was taking very deep breaths consistent with hyperventilation. I feel this prolonged symptoms of tingling and muscle spasms with feelings of depersonalization. Patient is counseled to return if she should develop any signs of redness or problems around the injection site or any other concerning symptoms. Reccomendation is to follow-up with her family provider within one to 2 days to return to the emergency department if concerning symptoms develop.    Charlesetta Shanks, MD 12/25/15 [id number]

## 2015-12-30 ENCOUNTER — Encounter: Payer: Self-pay | Admitting: Internal Medicine

## 2015-12-30 ENCOUNTER — Ambulatory Visit (INDEPENDENT_AMBULATORY_CARE_PROVIDER_SITE_OTHER): Payer: Medicare Other | Admitting: Internal Medicine

## 2015-12-30 VITALS — BP 132/80 | HR 96 | Temp 97.8°F | Resp 16 | Ht 60.0 in | Wt 233.0 lb

## 2015-12-30 DIAGNOSIS — M159 Polyosteoarthritis, unspecified: Secondary | ICD-10-CM

## 2015-12-30 DIAGNOSIS — I10 Essential (primary) hypertension: Secondary | ICD-10-CM | POA: Diagnosis not present

## 2015-12-30 DIAGNOSIS — M150 Primary generalized (osteo)arthritis: Secondary | ICD-10-CM | POA: Diagnosis not present

## 2015-12-30 NOTE — Progress Notes (Signed)
Subjective:  Patient ID: Megan Rose, female    DOB: 1951-04-07  Age: 65 y.o. MRN: [id number]  CC: Hypertension   HPI Megan Rose presents for f/up after a vasovagal spell that occurred about one week ago after she received an injection in her left elbow at an ortho office. She has felt well since then with no more syncopal episodes.  Outpatient Prescriptions Prior to Visit  Medication Sig Dispense Refill  . albuterol (PROVENTIL HFA;VENTOLIN HFA) 108 (90 BASE) MCG/ACT inhaler Inhale 2 puffs into the lungs every 6 (six) hours as needed for wheezing or shortness of breath. 1 Inhaler 2  . aspirin 81 MG tablet Take 81 mg by mouth daily.    . chlorpheniramine (CHLOR-TRIMETON) 4 MG tablet Take 1 tablet (4 mg total) by mouth every 4 (four) hours as needed for allergies. 120 tablet 11  . famotidine (PEPCID) 40 MG tablet Take 80 mg by mouth at bedtime.     . naproxen sodium (ANAPROX) 220 MG tablet Take 220 mg by mouth 2 (two) times daily as needed (pain).     Marland Kitchen omeprazole (PRILOSEC) 40 MG capsule Take 1 capsule (40 mg total) by mouth daily. 90 capsule 3  . valsartan-hydrochlorothiazide (DIOVAN HCT) 160-25 MG tablet Take 1 tablet by mouth daily. 30 tablet 11  . fluocinonide-emollient (LIDEX-E) 0.05 % cream Apply 1 application topically 2 (two) times daily. (Patient not taking: Reported on 12/30/2015) 120 g 2   No facility-administered medications prior to visit.    ROS Review of Systems  Constitutional: Negative.  Negative for fever, chills, diaphoresis, appetite change and fatigue.  HENT: Negative.   Eyes: Negative.  Negative for visual disturbance.  Respiratory: Negative.  Negative for cough, choking, chest tightness, shortness of breath and stridor.   Cardiovascular: Negative.  Negative for chest pain, palpitations and leg swelling.  Gastrointestinal: Negative.  Negative for nausea, vomiting, abdominal pain, diarrhea and constipation.  Endocrine: Negative.   Genitourinary:  Negative.  Negative for dysuria, urgency, decreased urine volume and difficulty urinating.  Musculoskeletal: Positive for arthralgias. Negative for myalgias, back pain and neck pain.  Skin: Negative.  Negative for color change.  Neurological: Negative.  Negative for dizziness, tremors, seizures, weakness, light-headedness, numbness and headaches.  Hematological: Negative.  Negative for adenopathy. Does not bruise/bleed easily.  Psychiatric/Behavioral: Negative.     Objective:  BP 132/80 mmHg  Pulse 96  Temp(Src) 97.8 F (36.6 C) (Oral)  Resp 16  Ht 5' (1.524 m)  Wt 233 lb (105.688 kg)  BMI 45.50 kg/m2  SpO2 96%  BP Readings from Last 3 Encounters:  12/30/15 132/80  12/25/15 130/67  12/11/15 126/80    Wt Readings from Last 3 Encounters:  12/30/15 233 lb (105.688 kg)  12/25/15 228 lb (103.42 kg)  12/11/15 232 lb (105.235 kg)    Physical Exam  Constitutional: She is oriented to person, place, and time. No distress.  HENT:  Mouth/Throat: Oropharynx is clear and moist. No oropharyngeal exudate.  Eyes: Conjunctivae are normal. Right eye exhibits no discharge. Left eye exhibits no discharge. No scleral icterus.  Neck: Normal range of motion. Neck supple. No JVD present. No tracheal deviation present. No thyromegaly present.  Cardiovascular: Normal rate, regular rhythm, normal heart sounds and intact distal pulses.  Exam reveals no gallop and no friction rub.   No murmur heard. Pulmonary/Chest: Effort normal and breath sounds normal. No stridor. No respiratory distress. She has no wheezes. She has no rales. She exhibits no tenderness.  Abdominal: Soft. Bowel sounds  are normal. She exhibits no distension and no mass. There is no tenderness. There is no rebound and no guarding.  Musculoskeletal: Normal range of motion. She exhibits no edema or tenderness.  Lymphadenopathy:    She has no cervical adenopathy.  Neurological: She is oriented to person, place, and time.  Skin: Skin is  warm and dry. No rash noted. She is not diaphoretic. No erythema. No pallor.  Vitals reviewed.   Lab Results  Component Value Date   WBC 9.9 12/25/2015   HGB 15.0 12/25/2015   HCT 42.9 12/25/2015   PLT 264 12/25/2015   GLUCOSE 160* 12/25/2015   CHOL 222* 10/22/2015   TRIG 227.0* 10/22/2015   HDL 48.00 10/22/2015   LDLDIRECT 140.0 10/22/2015   ALT 16 10/22/2015   AST 17 10/22/2015   NA 138 12/25/2015   K 3.5 12/25/2015   CL 104 12/25/2015   CREATININE 1.00 12/25/2015   BUN 19 12/25/2015   CO2 24 12/25/2015   TSH 2.67 10/22/2015   HGBA1C 6.2 10/22/2015    No results found.  Assessment & Plan:   Megan Rose was seen today for hypertension.  Diagnoses and all orders for this visit:  Essential hypertension- Her blood pressure is well-controlled and she has recovered nicely from the vasovagal episode with no recurrences or ongoing signs or symptoms.  Primary osteoarthritis involving multiple joints- she will continue to follow-up with orthopedics as needed.  I am having Megan Rose maintain her albuterol, naproxen sodium, aspirin, valsartan-hydrochlorothiazide, famotidine, omeprazole, fluocinonide-emollient, and chlorpheniramine.  No orders of the defined types were placed in this encounter.     Follow-up: Return in about 4 months (around 04/30/2016).  Scarlette Calico, MD

## 2015-12-30 NOTE — Patient Instructions (Signed)
Hypertension Hypertension, commonly called high blood pressure, is when the force of blood pumping through your arteries is too strong. Your arteries are the blood vessels that carry blood from your heart throughout your body. A blood pressure reading consists of a higher number over a lower number, such as 110/72. The higher number (systolic) is the pressure inside your arteries when your heart pumps. The lower number (diastolic) is the pressure inside your arteries when your heart relaxes. Ideally you want your blood pressure below 120/80. Hypertension forces your heart to work harder to pump blood. Your arteries may become narrow or stiff. Having untreated or uncontrolled hypertension can cause heart attack, stroke, kidney disease, and other problems. RISK FACTORS Some risk factors for high blood pressure are controllable. Others are not.  Risk factors you cannot control include:   Race. You may be at higher risk if you are African American.  Age. Risk increases with age.  Gender. Men are at higher risk than women before age 45 years. After age 65, women are at higher risk than men. Risk factors you can control include:  Not getting enough exercise or physical activity.  Being overweight.  Getting too much fat, sugar, calories, or salt in your diet.  Drinking too much alcohol. SIGNS AND SYMPTOMS Hypertension does not usually cause signs or symptoms. Extremely high blood pressure (hypertensive crisis) may cause headache, anxiety, shortness of breath, and nosebleed. DIAGNOSIS To check if you have hypertension, your health care provider will measure your blood pressure while you are seated, with your arm held at the level of your heart. It should be measured at least twice using the same arm. Certain conditions can cause a difference in blood pressure between your right and left arms. A blood pressure reading that is higher than normal on one occasion does not mean that you need treatment. If  it is not clear whether you have high blood pressure, you may be asked to return on a different day to have your blood pressure checked again. Or, you may be asked to monitor your blood pressure at home for 1 or more weeks. TREATMENT Treating high blood pressure includes making lifestyle changes and possibly taking medicine. Living a healthy lifestyle can help lower high blood pressure. You may need to change some of your habits. Lifestyle changes may include:  Following the DASH diet. This diet is high in fruits, vegetables, and whole grains. It is low in salt, red meat, and added sugars.  Keep your sodium intake below 2,300 mg per day.  Getting at least 30-45 minutes of aerobic exercise at least 4 times per week.  Losing weight if necessary.  Not smoking.  Limiting alcoholic beverages.  Learning ways to reduce stress. Your health care provider may prescribe medicine if lifestyle changes are not enough to get your blood pressure under control, and if one of the following is true:  You are 18-59 years of age and your systolic blood pressure is above 140.  You are 60 years of age or older, and your systolic blood pressure is above 150.  Your diastolic blood pressure is above 90.  You have diabetes, and your systolic blood pressure is over 140 or your diastolic blood pressure is over 90.  You have kidney disease and your blood pressure is above 140/90.  You have heart disease and your blood pressure is above 140/90. Your personal target blood pressure may vary depending on your medical conditions, your age, and other factors. HOME CARE INSTRUCTIONS    Have your blood pressure rechecked as directed by your health care provider.   Take medicines only as directed by your health care provider. Follow the directions carefully. Blood pressure medicines must be taken as prescribed. The medicine does not work as well when you skip doses. Skipping doses also puts you at risk for  problems.  Do not smoke.   Monitor your blood pressure at home as directed by your health care provider. SEEK MEDICAL CARE IF:   You think you are having a reaction to medicines taken.  You have recurrent headaches or feel dizzy.  You have swelling in your ankles.  You have trouble with your vision. SEEK IMMEDIATE MEDICAL CARE IF:  You develop a severe headache or confusion.  You have unusual weakness, numbness, or feel faint.  You have severe chest or abdominal pain.  You vomit repeatedly.  You have trouble breathing. MAKE SURE YOU:   Understand these instructions.  Will watch your condition.  Will get help right away if you are not doing well or get worse.   This information is not intended to replace advice given to you by your health care provider. Make sure you discuss any questions you have with your health care provider.   Document Released: 09/07/2005 Document Revised: 01/22/2015 Document Reviewed: 06/30/2013 Elsevier Interactive Patient Education 2016 [employer] Inc[employer]  

## 2015-12-30 NOTE — Progress Notes (Signed)
Pre visit review using our clinic review tool, if applicable. No additional management support is needed unless otherwise documented below in the visit note. 

## 2016-01-02 ENCOUNTER — Telehealth: Payer: Self-pay | Admitting: Internal Medicine

## 2016-01-02 NOTE — Telephone Encounter (Signed)
Patient would like to know if carfate with zantac would be a could way for her to go with acid reflux considering her limited budget.  If so, patient is requesting script to be sent to [facility] on [address].

## 2016-01-02 NOTE — Telephone Encounter (Signed)
I think she should be fine with just Zantac. Just take Zantac for the time being.

## 2016-01-02 NOTE — Telephone Encounter (Signed)
LMOVM informed pt of msg below

## 2016-01-02 NOTE — Telephone Encounter (Signed)
Please advise 

## 2016-03-01 DIAGNOSIS — R197 Diarrhea, unspecified: Secondary | ICD-10-CM | POA: Diagnosis not present

## 2016-03-01 DIAGNOSIS — R1084 Generalized abdominal pain: Secondary | ICD-10-CM | POA: Diagnosis not present

## 2016-03-01 DIAGNOSIS — R1110 Vomiting, unspecified: Secondary | ICD-10-CM | POA: Diagnosis not present

## 2016-03-01 DIAGNOSIS — R109 Unspecified abdominal pain: Secondary | ICD-10-CM | POA: Diagnosis not present

## 2016-03-01 DIAGNOSIS — D3500 Benign neoplasm of unspecified adrenal gland: Secondary | ICD-10-CM | POA: Diagnosis not present

## 2016-03-01 DIAGNOSIS — K529 Noninfective gastroenteritis and colitis, unspecified: Secondary | ICD-10-CM | POA: Diagnosis not present

## 2016-03-01 DIAGNOSIS — I10 Essential (primary) hypertension: Secondary | ICD-10-CM | POA: Diagnosis not present

## 2016-03-17 ENCOUNTER — Ambulatory Visit: Payer: Medicare Other | Admitting: Internal Medicine

## 2016-06-02 ENCOUNTER — Other Ambulatory Visit (INDEPENDENT_AMBULATORY_CARE_PROVIDER_SITE_OTHER): Payer: Medicare Other

## 2016-06-02 ENCOUNTER — Encounter: Payer: Self-pay | Admitting: Internal Medicine

## 2016-06-02 ENCOUNTER — Ambulatory Visit (INDEPENDENT_AMBULATORY_CARE_PROVIDER_SITE_OTHER): Payer: Medicare Other | Admitting: Internal Medicine

## 2016-06-02 ENCOUNTER — Ambulatory Visit (INDEPENDENT_AMBULATORY_CARE_PROVIDER_SITE_OTHER)
Admission: RE | Admit: 2016-06-02 | Discharge: 2016-06-02 | Disposition: A | Discharge status: Home / Self Care | Payer: Medicare Other | Source: Ambulatory Visit | Attending: Internal Medicine | Admitting: Internal Medicine

## 2016-06-02 VITALS — BP 140/90 | HR 83 | Wt 228.8 lb

## 2016-06-02 DIAGNOSIS — R739 Hyperglycemia, unspecified: Secondary | ICD-10-CM | POA: Diagnosis not present

## 2016-06-02 DIAGNOSIS — N6310 Unspecified lump in the right breast, unspecified quadrant: Secondary | ICD-10-CM

## 2016-06-02 DIAGNOSIS — I10 Essential (primary) hypertension: Secondary | ICD-10-CM

## 2016-06-02 DIAGNOSIS — S069X1A Unspecified intracranial injury with loss of consciousness of 30 minutes or less, initial encounter: Secondary | ICD-10-CM

## 2016-06-02 DIAGNOSIS — R51 Headache: Secondary | ICD-10-CM | POA: Diagnosis not present

## 2016-06-02 DIAGNOSIS — N63 Unspecified lump in breast: Secondary | ICD-10-CM | POA: Diagnosis not present

## 2016-06-02 DIAGNOSIS — S069X9A Unspecified intracranial injury with loss of consciousness of unspecified duration, initial encounter: Secondary | ICD-10-CM

## 2016-06-02 LAB — BASIC METABOLIC PANEL
BUN: 17 mg/dL (ref 6–23)
CHLORIDE: 102 meq/L (ref 96–112)
CO2: 31 meq/L (ref 19–32)
Calcium: 9.5 mg/dL (ref 8.4–10.5)
Creatinine, Ser: 0.94 mg/dL (ref 0.40–1.20)
GFR: 63.36 mL/min (ref >60.00)
Glucose, Bld: 154 mg/dL — ABNORMAL HIGH (ref 70–99)
POTASSIUM: 4.4 meq/L (ref 3.5–5.1)
SODIUM: 137 meq/L (ref 135–145)

## 2016-06-02 LAB — HEMOGLOBIN A1C: HEMOGLOBIN A1C: 6.3 % (ref 4.6–6.5)

## 2016-06-02 NOTE — Patient Instructions (Signed)
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
## 2016-06-11 ENCOUNTER — Ambulatory Visit
Admission: RE | Admit: 2016-06-11 | Discharge: 2016-06-11 | Disposition: A | Discharge status: Home / Self Care | Payer: Medicare Other | Source: Ambulatory Visit | Attending: Internal Medicine | Admitting: Internal Medicine

## 2016-06-11 DIAGNOSIS — N6489 Other specified disorders of breast: Secondary | ICD-10-CM | POA: Diagnosis not present

## 2016-06-11 DIAGNOSIS — N6310 Unspecified lump in the right breast, unspecified quadrant: Secondary | ICD-10-CM

## 2016-06-17 DIAGNOSIS — Z23 Encounter for immunization: Secondary | ICD-10-CM | POA: Diagnosis not present

## 2016-08-10 ENCOUNTER — Ambulatory Visit (INDEPENDENT_AMBULATORY_CARE_PROVIDER_SITE_OTHER): Payer: Medicare Other | Admitting: Internal Medicine

## 2016-08-10 ENCOUNTER — Ambulatory Visit (INDEPENDENT_AMBULATORY_CARE_PROVIDER_SITE_OTHER)
Admission: RE | Admit: 2016-08-10 | Discharge: 2016-08-10 | Disposition: A | Discharge status: Home / Self Care | Payer: Medicare Other | Source: Ambulatory Visit | Attending: Internal Medicine | Admitting: Internal Medicine

## 2016-08-10 ENCOUNTER — Encounter: Payer: Self-pay | Admitting: Internal Medicine

## 2016-08-10 ENCOUNTER — Other Ambulatory Visit (INDEPENDENT_AMBULATORY_CARE_PROVIDER_SITE_OTHER): Payer: Medicare Other

## 2016-08-10 VITALS — BP 154/90 | HR 90 | Temp 97.9°F | Resp 16 | Ht 60.0 in | Wt 225.2 lb

## 2016-08-10 DIAGNOSIS — R0789 Other chest pain: Secondary | ICD-10-CM | POA: Diagnosis not present

## 2016-08-10 DIAGNOSIS — R05 Cough: Secondary | ICD-10-CM | POA: Diagnosis not present

## 2016-08-10 DIAGNOSIS — I10 Essential (primary) hypertension: Secondary | ICD-10-CM | POA: Diagnosis not present

## 2016-08-10 DIAGNOSIS — R0602 Shortness of breath: Secondary | ICD-10-CM

## 2016-08-10 DIAGNOSIS — J301 Allergic rhinitis due to pollen: Secondary | ICD-10-CM

## 2016-08-10 DIAGNOSIS — J4531 Mild persistent asthma with (acute) exacerbation: Secondary | ICD-10-CM | POA: Diagnosis not present

## 2016-08-10 DIAGNOSIS — Z23 Encounter for immunization: Secondary | ICD-10-CM | POA: Diagnosis not present

## 2016-08-10 DIAGNOSIS — R059 Cough, unspecified: Secondary | ICD-10-CM | POA: Insufficient documentation

## 2016-08-10 DIAGNOSIS — J309 Allergic rhinitis, unspecified: Secondary | ICD-10-CM | POA: Insufficient documentation

## 2016-08-10 LAB — COMPREHENSIVE METABOLIC PANEL
ALBUMIN: 4.4 g/dL (ref 3.5–5.2)
ALT: 16 U/L (ref 0–35)
AST: 14 U/L (ref 0–37)
Alkaline Phosphatase: 84 U/L (ref 39–117)
BILIRUBIN TOTAL: 0.5 mg/dL (ref 0.2–1.2)
BUN: 17 mg/dL (ref 6–23)
CALCIUM: 10.3 mg/dL (ref 8.4–10.5)
CHLORIDE: 99 meq/L (ref 96–112)
CO2: 30 meq/L (ref 19–32)
CREATININE: 0.97 mg/dL (ref 0.40–1.20)
GFR: 61.06 mL/min (ref >60.00)
Glucose, Bld: 138 mg/dL — ABNORMAL HIGH (ref 70–99)
Potassium: 4.2 mEq/L (ref 3.5–5.1)
SODIUM: 138 meq/L (ref 135–145)
Total Protein: 7.9 g/dL (ref 6.0–8.3)

## 2016-08-10 LAB — MAGNESIUM: MAGNESIUM: 2 mg/dL (ref 1.5–2.5)

## 2016-08-10 MED ORDER — ALBUTEROL SULFATE HFA 108 (90 BASE) MCG/ACT IN AERS: 2.0000 | INHALATION_SPRAY | Freq: Four times a day (QID) | RESPIRATORY_TRACT | 2 refills | Status: AC | PRN

## 2016-08-10 MED ORDER — BUDESONIDE-FORMOTEROL FUMARATE 80-4.5 MCG/ACT IN AERO: 2.0000 | INHALATION_SPRAY | Freq: Two times a day (BID) | RESPIRATORY_TRACT | 11 refills | Status: AC

## 2016-08-10 MED ORDER — METHYLPREDNISOLONE 4 MG PO TBPK: ORAL_TABLET | ORAL | 0 refills | Status: AC

## 2016-08-10 NOTE — Patient Instructions (Signed)
Cough, Adult Coughing is a reflex that clears your throat and your airways. Coughing helps to heal and protect your lungs. It is normal to cough occasionally, but a cough that happens with other symptoms or lasts a long time may be a sign of a condition that needs treatment. A cough may last only 2-3 weeks (acute), or it may last longer than 8 weeks (chronic). What are the causes? Coughing is commonly caused by:  Breathing in substances that irritate your lungs.  A viral or bacterial respiratory infection.  Allergies.  Asthma.  Postnasal drip.  Smoking.  Acid backing up from the stomach into the esophagus (gastroesophageal reflux).  Certain medicines.  Chronic lung problems, including COPD (or rarely, lung cancer).  Other medical conditions such as heart failure.  Follow these instructions at home: Pay attention to any changes in your symptoms. Take these actions to help with your discomfort:  Take medicines only as told by your health care provider. ? If you were prescribed an antibiotic medicine, take it as told by your health care provider. Do not stop taking the antibiotic even if you start to feel better. ? Talk with your health care provider before you take a cough suppressant medicine.  Drink enough fluid to keep your urine clear or pale yellow.  If the air is dry, use a cold steam vaporizer or humidifier in your bedroom or your home to help loosen secretions.  Avoid anything that causes you to cough at work or at home.  If your cough is worse at night, try sleeping in a semi-upright position.  Avoid cigarette smoke. If you smoke, quit smoking. If you need help quitting, ask your health care provider.  Avoid caffeine.  Avoid alcohol.  Rest as needed.  Contact a health care provider if:  You have new symptoms.  You cough up pus.  Your cough does not get better after 2-3 weeks, or your cough gets worse.  You cannot control your cough with suppressant  medicines and you are losing sleep.  You develop pain that is getting worse or pain that is not controlled with pain medicines.  You have a fever.  You have unexplained weight loss.  You have night sweats. Get help right away if:  You cough up blood.  You have difficulty breathing.  Your heartbeat is very fast. This information is not intended to replace advice given to you by your health care provider. Make sure you discuss any questions you have with your health care provider. Document Released: 03/06/2011 Document Revised: 02/13/2016 Document Reviewed: 11/14/2014 Elsevier Interactive Patient Education  2017 [employer]  

## 2016-08-10 NOTE — Progress Notes (Signed)
Pre visit review using our clinic review tool, if applicable. No additional management support is needed unless otherwise documented below in the visit note. 

## 2016-08-10 NOTE — Progress Notes (Signed)
Subjective:  Patient ID: Megan Rose, female    DOB: 1950-11-18  Age: 65 y.o. MRN: [id number]  CC: Cough; Asthma; Allergic Rhinitis ; and Hypertension   HPI Dwayne Lierman presents for a 3 wk hx of NP cough, sore throat, runny nose, nasal congestion, ears popping, shortness of breath, wheezing, diffuse left-sided chest wall pain, and diffuse "body cramps."   Outpatient Medications Prior to Visit  Medication Sig Dispense Refill  . aspirin 81 MG tablet Take 81 mg by mouth daily.    . chlorpheniramine (CHLOR-TRIMETON) 4 MG tablet Take 1 tablet (4 mg total) by mouth every 4 (four) hours as needed for allergies. 120 tablet 11  . naproxen sodium (ANAPROX) 220 MG tablet Take 220 mg by mouth 2 (two) times daily as needed (pain).     Marland Kitchen omeprazole (PRILOSEC) 40 MG capsule Take 1 capsule (40 mg total) by mouth daily. 90 capsule 3  . valsartan-hydrochlorothiazide (DIOVAN HCT) 160-25 MG tablet Take 1 tablet by mouth daily. 30 tablet 11  . albuterol (PROVENTIL HFA;VENTOLIN HFA) 108 (90 BASE) MCG/ACT inhaler Inhale 2 puffs into the lungs every 6 (six) hours as needed for wheezing or shortness of breath. 1 Inhaler 2  . famotidine (PEPCID) 40 MG tablet Take 80 mg by mouth at bedtime.      No facility-administered medications prior to visit.     ROS Review of Systems  Constitutional: Negative for chills, diaphoresis, fatigue and fever.  HENT: Positive for congestion, ear pain, postnasal drip, rhinorrhea and sore throat. Negative for facial swelling, sinus pain, sneezing, trouble swallowing and voice change.   Eyes: Negative for visual disturbance.  Respiratory: Positive for cough, shortness of breath and wheezing. Negative for choking, chest tightness and stridor.   Cardiovascular: Negative.  Negative for chest pain, palpitations and leg swelling.  Gastrointestinal: Negative.  Negative for abdominal pain, constipation, diarrhea, nausea and vomiting.  Endocrine: Negative.   Genitourinary:  Negative.  Negative for decreased urine volume, difficulty urinating, dysuria, flank pain, frequency, hematuria and urgency.  Musculoskeletal: Positive for myalgias.  Skin: Negative.  Negative for color change and rash.  Neurological: Negative.  Negative for dizziness, tremors, weakness, light-headedness and headaches.  Hematological: Negative.   Psychiatric/Behavioral: Negative.     Objective:  BP (!) 154/90 (BP Location: Right Arm, Patient Position: Sitting, Cuff Size: Large)   Pulse 90   Temp 97.9 F (36.6 C) (Oral)   Resp 16   Ht 5' (1.524 m)   Wt 225 lb 4 oz (102.2 kg)   SpO2 94%   BMI 43.99 kg/m   BP Readings from Last 3 Encounters:  08/10/16 (!) 154/90  06/02/16 140/90  12/30/15 132/80    Wt Readings from Last 3 Encounters:  08/10/16 225 lb 4 oz (102.2 kg)  06/02/16 228 lb 12.8 oz (103.8 kg)  12/30/15 233 lb (105.7 kg)    Physical Exam  Constitutional: She is oriented to person, place, and time. No distress.  HENT:  Right Ear: Hearing, tympanic membrane, external ear and ear canal normal.  Left Ear: Hearing, tympanic membrane, external ear and ear canal normal.  Nose: Mucosal edema present. No rhinorrhea. No epistaxis. Right sinus exhibits no maxillary sinus tenderness and no frontal sinus tenderness. Left sinus exhibits no frontal sinus tenderness.  Mouth/Throat: Oropharynx is clear and moist and mucous membranes are normal. Mucous membranes are not pale, not dry and not cyanotic. No oral lesions. No trismus in the jaw. No uvula swelling. No oropharyngeal exudate, posterior oropharyngeal edema, posterior oropharyngeal  erythema or tonsillar abscesses.  Eyes: Conjunctivae are normal. Right eye exhibits no discharge. Left eye exhibits no discharge. No scleral icterus.  Neck: Normal range of motion. Neck supple. No JVD present. No tracheal deviation present. No thyromegaly present.  Cardiovascular: Normal rate, regular rhythm, normal heart sounds and intact distal pulses.   Exam reveals no gallop and no friction rub.   No murmur heard. Pulmonary/Chest: Effort normal. No accessory muscle usage. No tachypnea. No respiratory distress. She has no decreased breath sounds. She has no wheezes. She has no rhonchi. She has no rales. She exhibits tenderness and bony tenderness. She exhibits no mass, no crepitus, no deformity and no swelling.  There is diffuse tenderness to palpation over the left lateral rib cage.  Abdominal: Soft. Bowel sounds are normal. She exhibits no distension and no mass. There is no tenderness. There is no rebound and no guarding.  Musculoskeletal: Normal range of motion. She exhibits no edema, tenderness or deformity.  Lymphadenopathy:    She has no cervical adenopathy.  Neurological: She is oriented to person, place, and time.  Skin: Skin is warm and dry. No rash noted. She is not diaphoretic. No erythema. No pallor.    Lab Results  Component Value Date   WBC 9.9 12/25/2015   HGB 15.0 12/25/2015   HCT 42.9 12/25/2015   PLT 264 12/25/2015   GLUCOSE 138 (H) 08/10/2016   CHOL 222 (H) 10/22/2015   TRIG 227.0 (H) 10/22/2015   HDL 48.00 10/22/2015   LDLDIRECT 140.0 10/22/2015   ALT 16 08/10/2016   AST 14 08/10/2016   NA 138 08/10/2016   K 4.2 08/10/2016   CL 99 08/10/2016   CREATININE 0.97 08/10/2016   BUN 17 08/10/2016   CO2 30 08/10/2016   TSH 2.67 10/22/2015   HGBA1C 6.3 06/02/2016    US Breast Ltd Uni Right Inc Axilla  Result Date: 06/11/2016 CLINICAL DATA:  For a diagnostic right sided ultrasound due to a palpable abnormality for 1-2 months over the upper-outer right breast. Patient's physician also feels a palpable abnormality immediately above a Port-A-Cath scar over the upper central right breast. Patient has had previous bilateral malignant left mastectomy as well as right mastectomy 2012. EXAM: ULTRASOUND OF THE RIGHT BREAST COMPARISON:  None. FINDINGS: On physical exam, I palpate no definite focal abnormality over the upper  outer right mastectomy site in the area of patient's palpable abnormality. I palpate a soft 1.5 cm oval mobile nodular area immediately above the Port-A-Cath scar in the upper central right breast. Targeted ultrasound is performed, showing no focal abnormality over the upper outer quadrant of the right mastectomy site to account for patient's palpable abnormality. Mild focally prominent fatty tissue immediately above the Port-A-Cath scar, but otherwise no concerning focal abnormality in this area. IMPRESSION: No focal abnormality in the upper-outer portion of the right mastectomy site nor over the upper central region above the Port-A-Cath scar to account for patient's palpable abnormalities. RECOMMENDATION: Recommend continued management of patient's palpable abnormalities on a clinical basis. I have discussed the findings and recommendations with the patient. Results were also provided in writing at the conclusion of the visit. If applicable, a reminder letter will be sent to the patient regarding the next appointment. BI-RADS CATEGORY  1: Negative. Electronically Signed   By: Marin Olp M.D.   On: 06/11/2016 12:20    Assessment & Plan:   Aretzy was seen today for cough, asthma, allergic rhinitis  and hypertension.  Diagnoses and all orders for  this visit:  Essential hypertension- her blood pressure is well-controlled, electrolytes and renal function are stable. -     Comprehensive metabolic panel; Future -     Magnesium; Future  Cough- her exam and chest x-ray are unremarkable, she most likely has a post viral URI cough, she does not want to take a cough suppressant. -     DG Chest 2 View; Future  Acute nonseasonal allergic rhinitis due to pollen- she is having a flareup of her symptoms with eustachian tube dysfunction so I've asked her to try a course of systemic steroids. -     methylPREDNISolone (MEDROL DOSEPAK) 4 MG TBPK tablet; TAKE AS DIRECTED  Mild persistent asthma with acute  exacerbation- I've asked her to start using a LABA/ICS combination. I gave her a sample of Symbicort and showed her how to use it. She demonstrated proficiency with its use. Since she is also having a flareup I have asked her to take a course of systemic steroids. -     budesonide-formoterol (SYMBICORT) 80-4.5 MCG/ACT inhaler; Inhale 2 puffs into the lungs 2 (two) times daily. -     albuterol (PROVENTIL HFA;VENTOLIN HFA) 108 (90 Base) MCG/ACT inhaler; Inhale 2 puffs into the lungs every 6 (six) hours as needed for wheezing or shortness of breath. -     methylPREDNISolone (MEDROL DOSEPAK) 4 MG TBPK tablet; TAKE AS DIRECTED  Shortness of breath  Need for prophylactic vaccination against Streptococcus pneumoniae (pneumococcus) -     Pneumococcal conjugate vaccine 13-valent   I have discontinued Ms. David's famotidine. I am also having her start on budesonide-formoterol and methylPREDNISolone. Additionally, I am having her maintain her naproxen sodium, aspirin, valsartan-hydrochlorothiazide, omeprazole, chlorpheniramine, and albuterol.  Meds ordered this encounter  Medications  . budesonide-formoterol (SYMBICORT) 80-4.5 MCG/ACT inhaler    Sig: Inhale 2 puffs into the lungs 2 (two) times daily.    Dispense:  1 Inhaler    Refill:  11  . albuterol (PROVENTIL HFA;VENTOLIN HFA) 108 (90 Base) MCG/ACT inhaler    Sig: Inhale 2 puffs into the lungs every 6 (six) hours as needed for wheezing or shortness of breath.    Dispense:  1 Inhaler    Refill:  2  . methylPREDNISolone (MEDROL DOSEPAK) 4 MG TBPK tablet    Sig: TAKE AS DIRECTED    Dispense:  21 tablet    Refill:  0     Follow-up: Return in about 3 weeks (around 08/31/2016).  Scarlette Calico, MD

## 2016-08-11 ENCOUNTER — Encounter: Payer: Self-pay | Admitting: Internal Medicine

## 2016-12-17 ENCOUNTER — Telehealth (INDEPENDENT_AMBULATORY_CARE_PROVIDER_SITE_OTHER): Payer: Self-pay | Admitting: Orthopedic Surgery

## 2016-12-17 NOTE — Telephone Encounter (Signed)
PATIENT LEFT MSG ON MY VM REQUESTING THAT A MEDICAL RECORDS RELEASE FORM BE MAILED TO HER AT [place] [id number], AS SHE HAS MOVED. I MAILED FORM TO HER TODAY. CALLBACK [place] [phone]

## 2016-12-29 DIAGNOSIS — M1712 Unilateral primary osteoarthritis, left knee: Secondary | ICD-10-CM | POA: Diagnosis not present

## 2016-12-29 DIAGNOSIS — D649 Anemia, unspecified: Secondary | ICD-10-CM | POA: Diagnosis not present

## 2016-12-29 DIAGNOSIS — E559 Vitamin D deficiency, unspecified: Secondary | ICD-10-CM | POA: Diagnosis not present

## 2016-12-29 DIAGNOSIS — Z6841 Body Mass Index (BMI) 40.0 and over, adult: Secondary | ICD-10-CM | POA: Diagnosis not present

## 2016-12-29 DIAGNOSIS — E1165 Type 2 diabetes mellitus with hyperglycemia: Secondary | ICD-10-CM | POA: Diagnosis not present

## 2016-12-29 DIAGNOSIS — Z1159 Encounter for screening for other viral diseases: Secondary | ICD-10-CM | POA: Diagnosis not present

## 2016-12-29 DIAGNOSIS — K219 Gastro-esophageal reflux disease without esophagitis: Secondary | ICD-10-CM | POA: Diagnosis not present

## 2016-12-29 DIAGNOSIS — E538 Deficiency of other specified B group vitamins: Secondary | ICD-10-CM | POA: Diagnosis not present

## 2016-12-29 DIAGNOSIS — M1711 Unilateral primary osteoarthritis, right knee: Secondary | ICD-10-CM | POA: Diagnosis not present

## 2016-12-29 DIAGNOSIS — I10 Essential (primary) hypertension: Secondary | ICD-10-CM | POA: Diagnosis not present

## 2017-02-16 DIAGNOSIS — E119 Type 2 diabetes mellitus without complications: Secondary | ICD-10-CM | POA: Diagnosis not present

## 2017-05-12 DIAGNOSIS — E1165 Type 2 diabetes mellitus with hyperglycemia: Secondary | ICD-10-CM | POA: Diagnosis not present

## 2017-05-12 DIAGNOSIS — I10 Essential (primary) hypertension: Secondary | ICD-10-CM | POA: Diagnosis not present

## 2017-06-05 DIAGNOSIS — Z23 Encounter for immunization: Secondary | ICD-10-CM | POA: Diagnosis not present

## 2017-06-07 DIAGNOSIS — I10 Essential (primary) hypertension: Secondary | ICD-10-CM | POA: Diagnosis not present

## 2017-06-07 DIAGNOSIS — Z7984 Long term (current) use of oral hypoglycemic drugs: Secondary | ICD-10-CM | POA: Diagnosis not present

## 2017-06-07 DIAGNOSIS — E1165 Type 2 diabetes mellitus with hyperglycemia: Secondary | ICD-10-CM | POA: Diagnosis not present

## 2017-06-07 DIAGNOSIS — Z853 Personal history of malignant neoplasm of breast: Secondary | ICD-10-CM | POA: Diagnosis not present

## 2017-06-07 DIAGNOSIS — L089 Local infection of the skin and subcutaneous tissue, unspecified: Secondary | ICD-10-CM | POA: Diagnosis not present

## 2017-06-08 IMAGING — DX DG ELBOW COMPLETE 3+V*L*
4 series · 4 of 4 positions shown · non-contrast
Comparison: None in PACs

CLINICAL DATA: One month history of posterior elbow pain without
known injury

EXAM:
LEFT ELBOW - COMPLETE 3+ VIEW

[elbow ap]
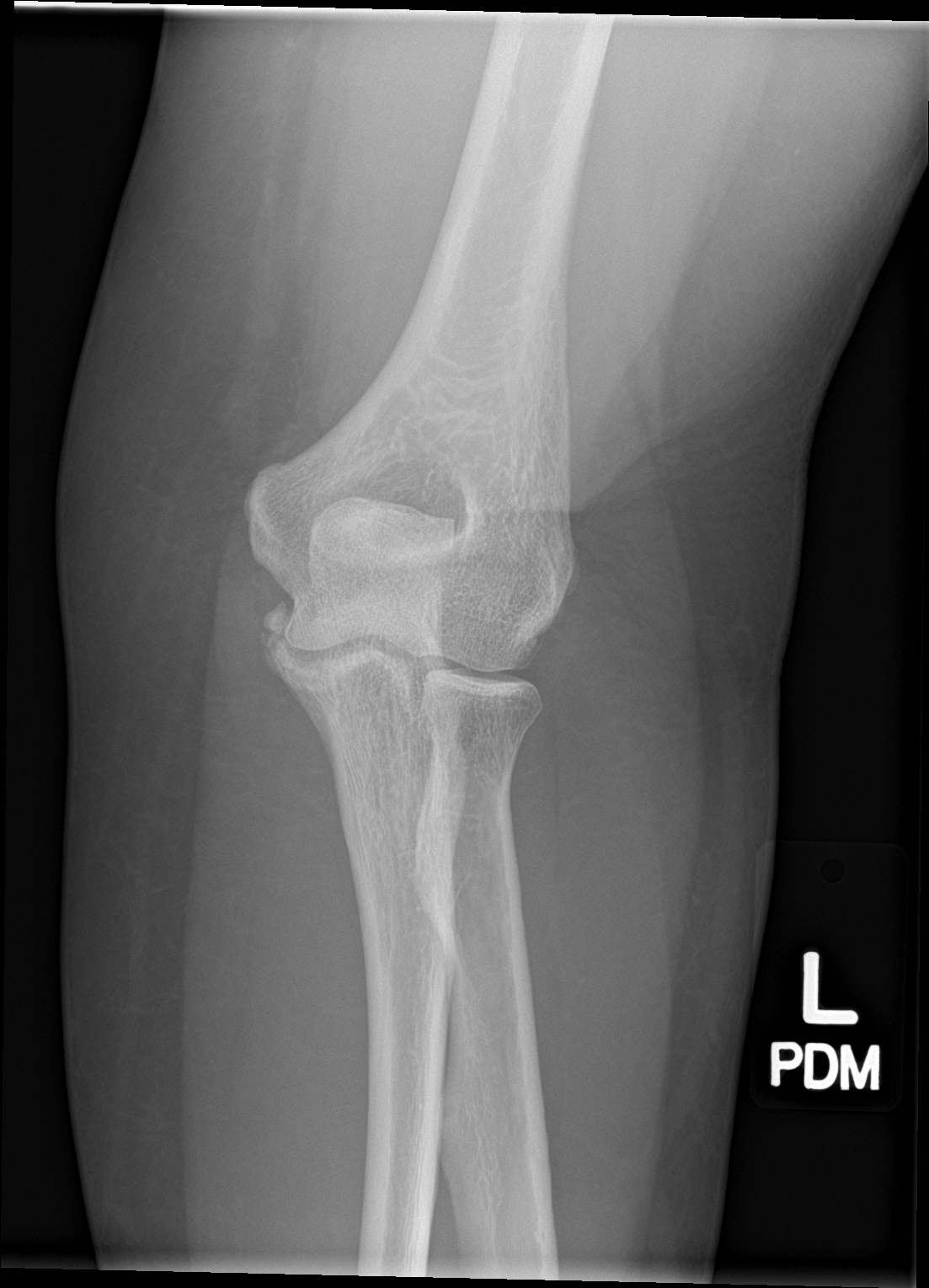

[elbow obl (1 of 2)]
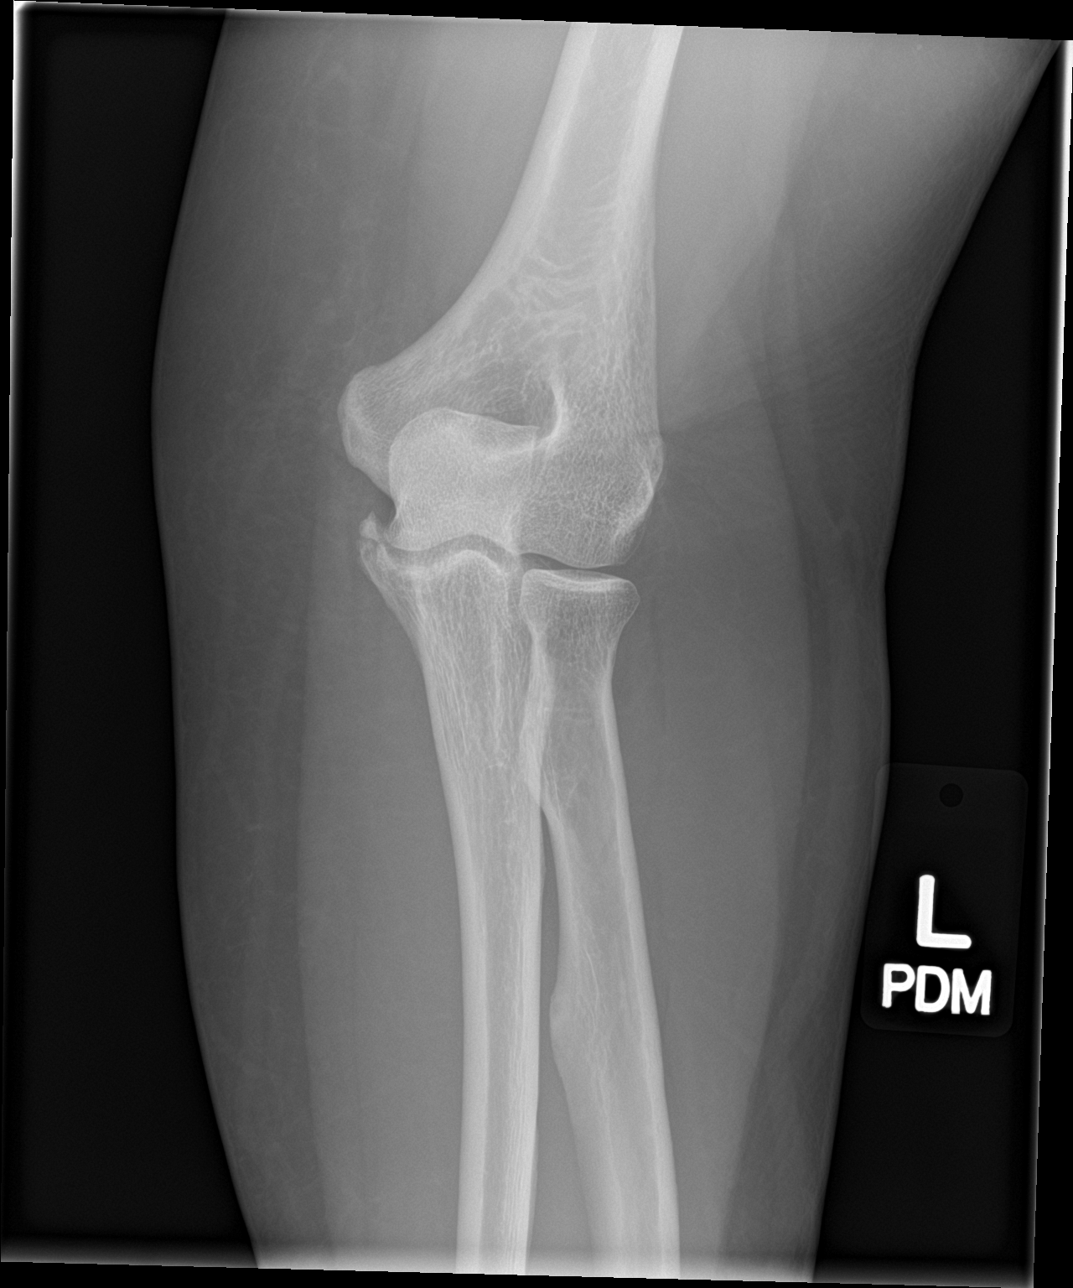

[elbow obl (2 of 2)]
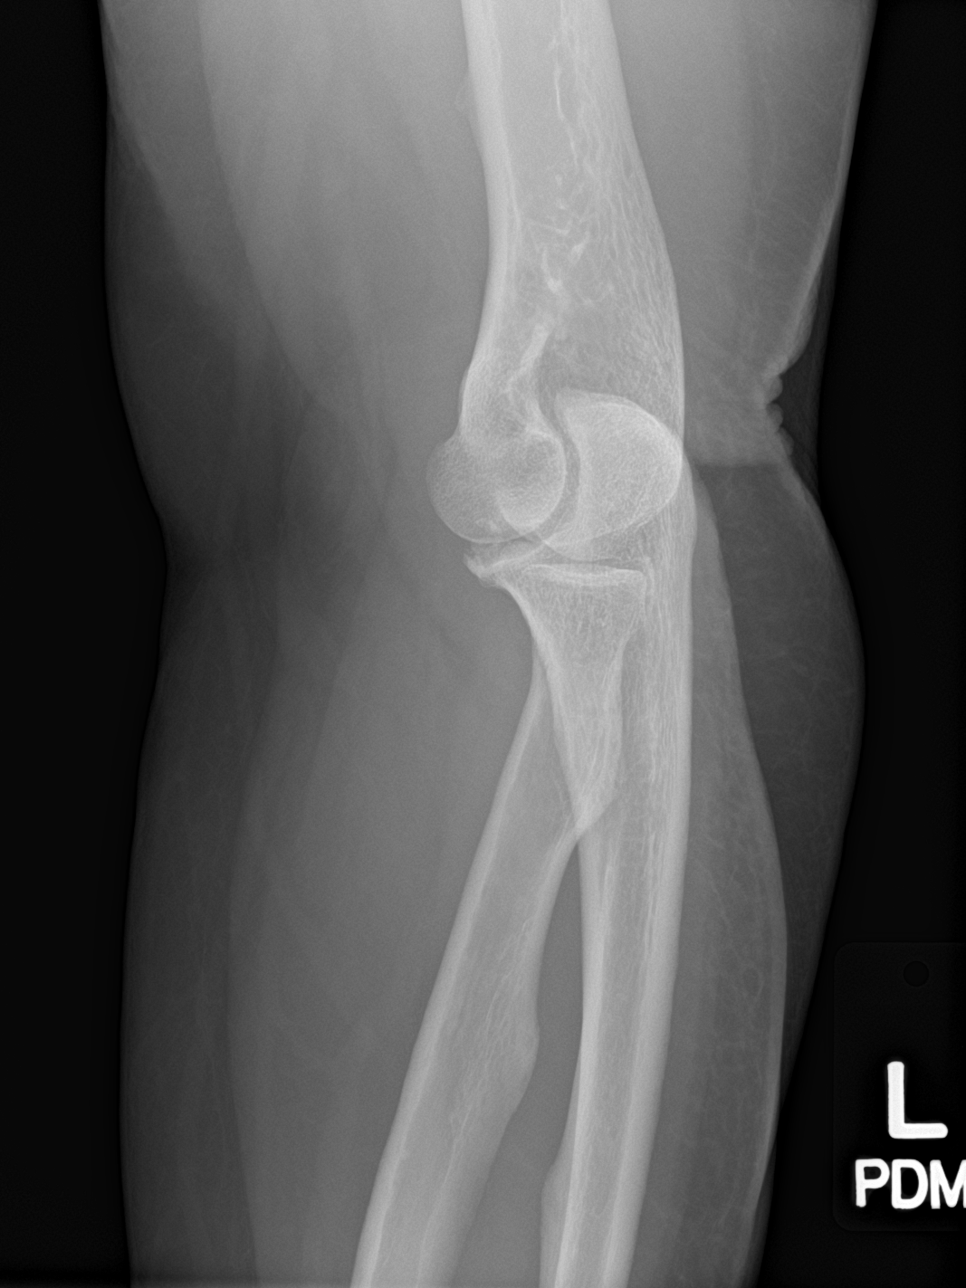

[elbow lat]
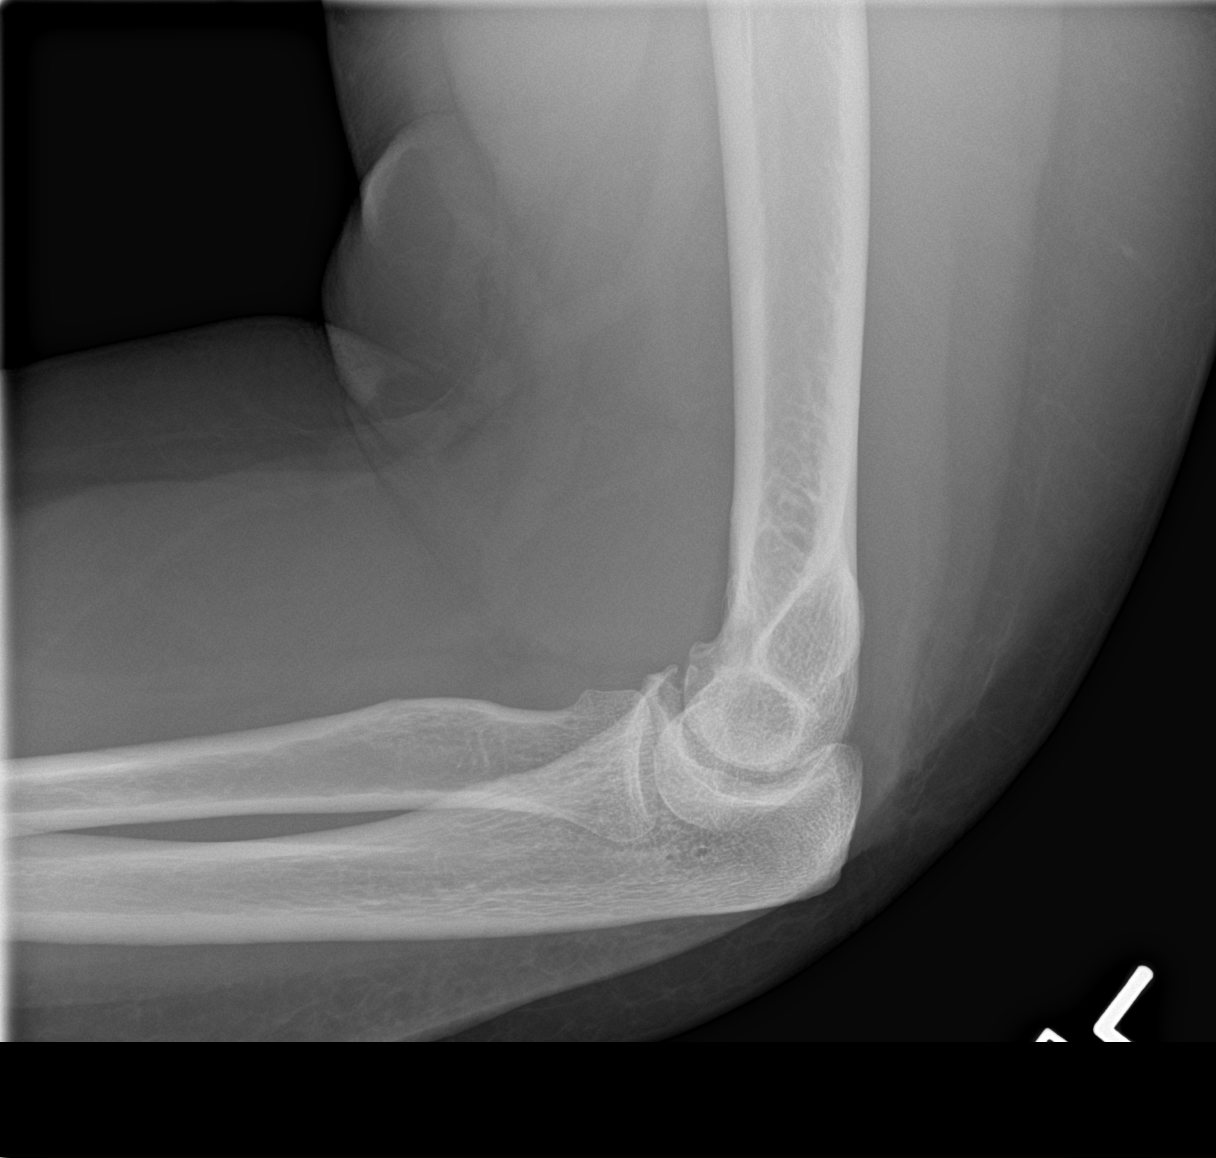

[4 of 4 positions shown; findings below may reference images not displayed]

FINDINGS: The bones are adequately mineralized. There is minimal spurring
along the medial aspect of the coronoid process of the olecranon.
There is no significant posterior olecranon spurring. No significant
swelling of the olecranon bursa is observed. There is no joint
effusion. The radial head and distal humerus are intact.
IMPRESSION: There is mild degenerative change centered on the coronoid process
of the olecranon. No posterior bony abnormalities are observed.

## 2017-06-25 DIAGNOSIS — C50412 Malignant neoplasm of upper-outer quadrant of left female breast: Secondary | ICD-10-CM | POA: Diagnosis not present

## 2017-07-08 DIAGNOSIS — R0789 Other chest pain: Secondary | ICD-10-CM | POA: Diagnosis not present

## 2017-07-08 DIAGNOSIS — Z9011 Acquired absence of right breast and nipple: Secondary | ICD-10-CM | POA: Diagnosis not present

## 2017-07-08 DIAGNOSIS — M6289 Other specified disorders of muscle: Secondary | ICD-10-CM | POA: Diagnosis not present

## 2017-07-08 DIAGNOSIS — C50412 Malignant neoplasm of upper-outer quadrant of left female breast: Secondary | ICD-10-CM | POA: Diagnosis not present

## 2017-07-21 DIAGNOSIS — D3501 Benign neoplasm of right adrenal gland: Secondary | ICD-10-CM | POA: Diagnosis not present

## 2017-07-21 DIAGNOSIS — R634 Abnormal weight loss: Secondary | ICD-10-CM | POA: Diagnosis not present

## 2017-07-21 DIAGNOSIS — C50412 Malignant neoplasm of upper-outer quadrant of left female breast: Secondary | ICD-10-CM | POA: Diagnosis not present

## 2017-07-21 DIAGNOSIS — R1909 Other intra-abdominal and pelvic swelling, mass and lump: Secondary | ICD-10-CM | POA: Diagnosis not present

## 2017-08-09 DIAGNOSIS — D4411 Neoplasm of uncertain behavior of right adrenal gland: Secondary | ICD-10-CM | POA: Diagnosis not present

## 2017-08-09 DIAGNOSIS — R1909 Other intra-abdominal and pelvic swelling, mass and lump: Secondary | ICD-10-CM | POA: Diagnosis not present

## 2017-08-09 DIAGNOSIS — D3501 Benign neoplasm of right adrenal gland: Secondary | ICD-10-CM | POA: Diagnosis not present

## 2017-08-09 DIAGNOSIS — C50412 Malignant neoplasm of upper-outer quadrant of left female breast: Secondary | ICD-10-CM | POA: Diagnosis not present

## 2017-08-26 DIAGNOSIS — R1909 Other intra-abdominal and pelvic swelling, mass and lump: Secondary | ICD-10-CM | POA: Diagnosis not present

## 2017-08-26 DIAGNOSIS — Z90710 Acquired absence of both cervix and uterus: Secondary | ICD-10-CM | POA: Diagnosis not present

## 2017-09-15 DIAGNOSIS — E118 Type 2 diabetes mellitus with unspecified complications: Secondary | ICD-10-CM | POA: Diagnosis not present

## 2017-09-15 DIAGNOSIS — Z01812 Encounter for preprocedural laboratory examination: Secondary | ICD-10-CM | POA: Diagnosis not present

## 2017-09-15 DIAGNOSIS — I493 Ventricular premature depolarization: Secondary | ICD-10-CM | POA: Diagnosis not present

## 2017-09-15 DIAGNOSIS — Z01810 Encounter for preprocedural cardiovascular examination: Secondary | ICD-10-CM | POA: Diagnosis not present

## 2017-09-15 DIAGNOSIS — I10 Essential (primary) hypertension: Secondary | ICD-10-CM | POA: Diagnosis not present

## 2017-09-30 DIAGNOSIS — N83291 Other ovarian cyst, right side: Secondary | ICD-10-CM | POA: Diagnosis not present

## 2017-09-30 DIAGNOSIS — D282 Benign neoplasm of uterine tubes and ligaments: Secondary | ICD-10-CM | POA: Diagnosis not present

## 2017-09-30 DIAGNOSIS — Z87891 Personal history of nicotine dependence: Secondary | ICD-10-CM | POA: Diagnosis not present

## 2017-09-30 DIAGNOSIS — Z853 Personal history of malignant neoplasm of breast: Secondary | ICD-10-CM | POA: Diagnosis not present

## 2017-09-30 DIAGNOSIS — I10 Essential (primary) hypertension: Secondary | ICD-10-CM | POA: Diagnosis not present

## 2017-09-30 DIAGNOSIS — R1909 Other intra-abdominal and pelvic swelling, mass and lump: Secondary | ICD-10-CM | POA: Diagnosis not present

## 2017-09-30 DIAGNOSIS — D270 Benign neoplasm of right ovary: Secondary | ICD-10-CM | POA: Diagnosis not present

## 2017-09-30 DIAGNOSIS — E669 Obesity, unspecified: Secondary | ICD-10-CM | POA: Diagnosis not present

## 2017-09-30 DIAGNOSIS — E119 Type 2 diabetes mellitus without complications: Secondary | ICD-10-CM | POA: Diagnosis not present

## 2017-09-30 DIAGNOSIS — Z7984 Long term (current) use of oral hypoglycemic drugs: Secondary | ICD-10-CM | POA: Diagnosis not present

## 2017-09-30 DIAGNOSIS — N949 Unspecified condition associated with female genital organs and menstrual cycle: Secondary | ICD-10-CM | POA: Diagnosis not present

## 2017-09-30 DIAGNOSIS — Z880 Allergy status to penicillin: Secondary | ICD-10-CM | POA: Diagnosis not present

## 2017-09-30 DIAGNOSIS — D271 Benign neoplasm of left ovary: Secondary | ICD-10-CM | POA: Diagnosis not present

## 2017-09-30 DIAGNOSIS — Z886 Allergy status to analgesic agent status: Secondary | ICD-10-CM | POA: Diagnosis not present

## 2017-09-30 DIAGNOSIS — Z6836 Body mass index (BMI) 36.0-36.9, adult: Secondary | ICD-10-CM | POA: Diagnosis not present

## 2017-10-26 DIAGNOSIS — R300 Dysuria: Secondary | ICD-10-CM | POA: Diagnosis not present

## 2017-10-26 DIAGNOSIS — R8271 Bacteriuria: Secondary | ICD-10-CM | POA: Diagnosis not present

## 2017-10-26 DIAGNOSIS — N949 Unspecified condition associated with female genital organs and menstrual cycle: Secondary | ICD-10-CM | POA: Diagnosis not present

## 2017-11-01 DIAGNOSIS — N200 Calculus of kidney: Secondary | ICD-10-CM | POA: Diagnosis not present

## 2017-11-01 DIAGNOSIS — N19 Unspecified kidney failure: Secondary | ICD-10-CM | POA: Diagnosis not present

## 2017-11-01 DIAGNOSIS — R300 Dysuria: Secondary | ICD-10-CM | POA: Diagnosis not present

## 2017-11-01 DIAGNOSIS — N281 Cyst of kidney, acquired: Secondary | ICD-10-CM | POA: Diagnosis not present

## 2017-11-01 DIAGNOSIS — N12 Tubulo-interstitial nephritis, not specified as acute or chronic: Secondary | ICD-10-CM | POA: Diagnosis not present

## 2017-11-01 DIAGNOSIS — I10 Essential (primary) hypertension: Secondary | ICD-10-CM | POA: Diagnosis not present

## 2017-11-01 DIAGNOSIS — D3501 Benign neoplasm of right adrenal gland: Secondary | ICD-10-CM | POA: Diagnosis not present

## 2017-11-01 DIAGNOSIS — R109 Unspecified abdominal pain: Secondary | ICD-10-CM | POA: Diagnosis not present

## 2017-11-01 DIAGNOSIS — N179 Acute kidney failure, unspecified: Secondary | ICD-10-CM | POA: Diagnosis not present

## 2017-11-01 DIAGNOSIS — R1011 Right upper quadrant pain: Secondary | ICD-10-CM | POA: Diagnosis not present

## 2017-11-01 DIAGNOSIS — K5730 Diverticulosis of large intestine without perforation or abscess without bleeding: Secondary | ICD-10-CM | POA: Diagnosis not present

## 2017-11-02 DIAGNOSIS — N179 Acute kidney failure, unspecified: Secondary | ICD-10-CM | POA: Diagnosis not present

## 2017-11-02 DIAGNOSIS — R1011 Right upper quadrant pain: Secondary | ICD-10-CM | POA: Diagnosis not present

## 2017-11-04 DIAGNOSIS — E1165 Type 2 diabetes mellitus with hyperglycemia: Secondary | ICD-10-CM | POA: Diagnosis not present

## 2017-11-04 DIAGNOSIS — N183 Chronic kidney disease, stage 3 (moderate): Secondary | ICD-10-CM | POA: Diagnosis not present

## 2017-11-04 DIAGNOSIS — I10 Essential (primary) hypertension: Secondary | ICD-10-CM | POA: Diagnosis not present

## 2017-11-04 DIAGNOSIS — N179 Acute kidney failure, unspecified: Secondary | ICD-10-CM | POA: Diagnosis not present

## 2017-11-08 DIAGNOSIS — I10 Essential (primary) hypertension: Secondary | ICD-10-CM | POA: Diagnosis not present

## 2017-11-08 DIAGNOSIS — E1165 Type 2 diabetes mellitus with hyperglycemia: Secondary | ICD-10-CM | POA: Diagnosis not present

## 2017-11-08 DIAGNOSIS — N183 Chronic kidney disease, stage 3 (moderate): Secondary | ICD-10-CM | POA: Diagnosis not present
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
## 2018-01-11 DIAGNOSIS — I10 Essential (primary) hypertension: Secondary | ICD-10-CM | POA: Diagnosis not present

## 2018-02-17 DIAGNOSIS — B9689 Other specified bacterial agents as the cause of diseases classified elsewhere: Secondary | ICD-10-CM | POA: Diagnosis not present

## 2018-02-17 DIAGNOSIS — E1165 Type 2 diabetes mellitus with hyperglycemia: Secondary | ICD-10-CM | POA: Diagnosis not present

## 2018-02-17 DIAGNOSIS — J019 Acute sinusitis, unspecified: Secondary | ICD-10-CM | POA: Diagnosis not present

## 2018-02-17 DIAGNOSIS — I10 Essential (primary) hypertension: Secondary | ICD-10-CM | POA: Diagnosis not present

## 2018-03-11 DIAGNOSIS — M25571 Pain in right ankle and joints of right foot: Secondary | ICD-10-CM | POA: Diagnosis not present

## 2018-03-11 DIAGNOSIS — M7731 Calcaneal spur, right foot: Secondary | ICD-10-CM | POA: Diagnosis not present

## 2018-03-11 DIAGNOSIS — I10 Essential (primary) hypertension: Secondary | ICD-10-CM | POA: Diagnosis not present

## 2018-03-11 DIAGNOSIS — M19071 Primary osteoarthritis, right ankle and foot: Secondary | ICD-10-CM | POA: Diagnosis not present

## 2018-03-11 DIAGNOSIS — M7989 Other specified soft tissue disorders: Secondary | ICD-10-CM | POA: Diagnosis not present

## 2018-03-11 DIAGNOSIS — M109 Gout, unspecified: Secondary | ICD-10-CM | POA: Diagnosis not present

## 2018-03-17 DIAGNOSIS — M25471 Effusion, right ankle: Secondary | ICD-10-CM | POA: Diagnosis not present

## 2018-03-17 DIAGNOSIS — M25571 Pain in right ankle and joints of right foot: Secondary | ICD-10-CM | POA: Diagnosis not present

## 2018-04-04 DIAGNOSIS — Z09 Encounter for follow-up examination after completed treatment for conditions other than malignant neoplasm: Secondary | ICD-10-CM | POA: Diagnosis not present

## 2018-04-04 DIAGNOSIS — N183 Chronic kidney disease, stage 3 (moderate): Secondary | ICD-10-CM | POA: Diagnosis not present

## 2018-04-04 DIAGNOSIS — E119 Type 2 diabetes mellitus without complications: Secondary | ICD-10-CM | POA: Diagnosis not present

## 2018-04-04 DIAGNOSIS — M1612 Unilateral primary osteoarthritis, left hip: Secondary | ICD-10-CM | POA: Diagnosis not present

## 2018-04-04 DIAGNOSIS — I10 Essential (primary) hypertension: Secondary | ICD-10-CM | POA: Diagnosis not present

## 2018-04-04 DIAGNOSIS — Z853 Personal history of malignant neoplasm of breast: Secondary | ICD-10-CM | POA: Diagnosis not present

## 2018-04-04 DIAGNOSIS — M25552 Pain in left hip: Secondary | ICD-10-CM | POA: Diagnosis not present

## 2018-07-19 DIAGNOSIS — E860 Dehydration: Secondary | ICD-10-CM | POA: Diagnosis not present

## 2018-07-19 DIAGNOSIS — J40 Bronchitis, not specified as acute or chronic: Secondary | ICD-10-CM | POA: Diagnosis not present

## 2018-07-19 DIAGNOSIS — R05 Cough: Secondary | ICD-10-CM | POA: Diagnosis not present

## 2018-07-19 DIAGNOSIS — I10 Essential (primary) hypertension: Secondary | ICD-10-CM | POA: Diagnosis not present

## 2018-07-19 DIAGNOSIS — H9203 Otalgia, bilateral: Secondary | ICD-10-CM | POA: Diagnosis not present

## 2018-07-19 DIAGNOSIS — R110 Nausea: Secondary | ICD-10-CM | POA: Diagnosis not present

## 2018-07-19 DIAGNOSIS — R491 Aphonia: Secondary | ICD-10-CM | POA: Diagnosis not present

## 2018-07-19 DIAGNOSIS — J329 Chronic sinusitis, unspecified: Secondary | ICD-10-CM | POA: Diagnosis not present

## 2018-07-19 DIAGNOSIS — J209 Acute bronchitis, unspecified: Secondary | ICD-10-CM | POA: Diagnosis not present

## 2018-07-19 DIAGNOSIS — J019 Acute sinusitis, unspecified: Secondary | ICD-10-CM | POA: Diagnosis not present

## 2018-07-19 DIAGNOSIS — Z87891 Personal history of nicotine dependence: Secondary | ICD-10-CM | POA: Diagnosis not present

## 2018-07-19 DIAGNOSIS — R509 Fever, unspecified: Secondary | ICD-10-CM | POA: Diagnosis not present

## 2018-08-05 DIAGNOSIS — J040 Acute laryngitis: Secondary | ICD-10-CM | POA: Diagnosis not present

## 2018-08-05 DIAGNOSIS — R0981 Nasal congestion: Secondary | ICD-10-CM | POA: Diagnosis not present

## 2018-08-05 DIAGNOSIS — J019 Acute sinusitis, unspecified: Secondary | ICD-10-CM | POA: Diagnosis not present

## 2018-08-05 DIAGNOSIS — J40 Bronchitis, not specified as acute or chronic: Secondary | ICD-10-CM | POA: Diagnosis not present

## 2018-08-05 DIAGNOSIS — R05 Cough: Secondary | ICD-10-CM | POA: Diagnosis not present

## 2018-08-05 DIAGNOSIS — H6503 Acute serous otitis media, bilateral: Secondary | ICD-10-CM | POA: Diagnosis not present

## 2018-08-05 DIAGNOSIS — J069 Acute upper respiratory infection, unspecified: Secondary | ICD-10-CM | POA: Diagnosis not present
